# Patient Record
Sex: Female | Born: 1965 | Race: White | Hispanic: No | Marital: Single | Smoking: Never smoker
Health system: Southern US, Community
[De-identification: ages and names within clinical notes are randomized; demographics above are authoritative.]

## PROBLEM LIST (undated history)

## (undated) DIAGNOSIS — Q431 Hirschsprung's disease: Secondary | ICD-10-CM

## (undated) DIAGNOSIS — K219 Gastro-esophageal reflux disease without esophagitis: Secondary | ICD-10-CM

## (undated) DIAGNOSIS — Q909 Down syndrome, unspecified: Secondary | ICD-10-CM

## (undated) DIAGNOSIS — E079 Disorder of thyroid, unspecified: Secondary | ICD-10-CM

## (undated) DIAGNOSIS — F0390 Unspecified dementia without behavioral disturbance: Secondary | ICD-10-CM

## (undated) DIAGNOSIS — K439 Ventral hernia without obstruction or gangrene: Secondary | ICD-10-CM

## (undated) HISTORY — PX: COLOSTOMY: SHX63

## (undated) HISTORY — DX: Ventral hernia without obstruction or gangrene: K43.9

---

## 2004-08-04 ENCOUNTER — Ambulatory Visit: Payer: Self-pay | Admitting: General Surgery

## 2004-08-07 ENCOUNTER — Emergency Department: Payer: Self-pay | Admitting: Unknown Physician Specialty

## 2005-02-11 ENCOUNTER — Ambulatory Visit: Payer: Self-pay | Admitting: Podiatry

## 2006-02-05 ENCOUNTER — Inpatient Hospital Stay: Payer: Self-pay | Admitting: Internal Medicine

## 2006-02-22 ENCOUNTER — Ambulatory Visit: Payer: Self-pay | Admitting: Surgery

## 2006-02-28 ENCOUNTER — Other Ambulatory Visit: Payer: Self-pay

## 2006-03-07 ENCOUNTER — Inpatient Hospital Stay: Payer: Self-pay | Admitting: Surgery

## 2007-03-29 ENCOUNTER — Ambulatory Visit: Payer: Self-pay | Admitting: Internal Medicine

## 2007-04-12 ENCOUNTER — Ambulatory Visit: Payer: Self-pay | Admitting: Internal Medicine

## 2008-08-21 ENCOUNTER — Ambulatory Visit: Payer: Self-pay | Admitting: Family Medicine

## 2008-09-18 ENCOUNTER — Ambulatory Visit: Payer: Self-pay | Admitting: Family Medicine

## 2008-10-18 ENCOUNTER — Ambulatory Visit: Payer: Self-pay | Admitting: Family Medicine

## 2009-01-14 ENCOUNTER — Ambulatory Visit: Payer: Self-pay

## 2010-10-12 ENCOUNTER — Ambulatory Visit: Payer: Self-pay | Admitting: Family Medicine

## 2011-12-20 ENCOUNTER — Ambulatory Visit: Payer: Self-pay | Admitting: Family Medicine

## 2012-01-24 ENCOUNTER — Ambulatory Visit: Payer: Self-pay | Admitting: Otolaryngology

## 2013-04-08 ENCOUNTER — Ambulatory Visit: Payer: Self-pay | Admitting: Family Medicine

## 2014-05-01 ENCOUNTER — Ambulatory Visit: Payer: Self-pay | Admitting: Family Medicine

## 2014-12-23 DIAGNOSIS — J302 Other seasonal allergic rhinitis: Secondary | ICD-10-CM | POA: Insufficient documentation

## 2014-12-23 DIAGNOSIS — K219 Gastro-esophageal reflux disease without esophagitis: Secondary | ICD-10-CM | POA: Insufficient documentation

## 2014-12-23 DIAGNOSIS — Q431 Hirschsprung's disease: Secondary | ICD-10-CM | POA: Insufficient documentation

## 2015-03-14 ENCOUNTER — Emergency Department: Payer: Medicare HMO

## 2015-03-14 ENCOUNTER — Inpatient Hospital Stay
Admission: EM | Admit: 2015-03-14 | Discharge: 2015-03-16 | DRG: 354 | Disposition: A | Discharge status: Home / Self Care | Payer: Medicare HMO | Attending: Surgery | Admitting: Surgery

## 2015-03-14 ENCOUNTER — Encounter: Payer: Self-pay | Admitting: *Deleted

## 2015-03-14 DIAGNOSIS — Z880 Allergy status to penicillin: Secondary | ICD-10-CM

## 2015-03-14 DIAGNOSIS — K219 Gastro-esophageal reflux disease without esophagitis: Secondary | ICD-10-CM | POA: Diagnosis present

## 2015-03-14 DIAGNOSIS — Q909 Down syndrome, unspecified: Secondary | ICD-10-CM

## 2015-03-14 DIAGNOSIS — R112 Nausea with vomiting, unspecified: Secondary | ICD-10-CM

## 2015-03-14 DIAGNOSIS — F70 Mild intellectual disabilities: Secondary | ICD-10-CM | POA: Diagnosis present

## 2015-03-14 DIAGNOSIS — K56600 Partial intestinal obstruction, unspecified as to cause: Secondary | ICD-10-CM

## 2015-03-14 DIAGNOSIS — Z933 Colostomy status: Secondary | ICD-10-CM

## 2015-03-14 DIAGNOSIS — E079 Disorder of thyroid, unspecified: Secondary | ICD-10-CM | POA: Diagnosis present

## 2015-03-14 DIAGNOSIS — Q431 Hirschsprung's disease: Secondary | ICD-10-CM

## 2015-03-14 DIAGNOSIS — K436 Other and unspecified ventral hernia with obstruction, without gangrene: Secondary | ICD-10-CM | POA: Diagnosis not present

## 2015-03-14 DIAGNOSIS — I959 Hypotension, unspecified: Secondary | ICD-10-CM

## 2015-03-14 DIAGNOSIS — R1084 Generalized abdominal pain: Secondary | ICD-10-CM

## 2015-03-14 DIAGNOSIS — E860 Dehydration: Secondary | ICD-10-CM

## 2015-03-14 HISTORY — DX: Gastro-esophageal reflux disease without esophagitis: K21.9

## 2015-03-14 HISTORY — DX: Disorder of thyroid, unspecified: E07.9

## 2015-03-14 HISTORY — DX: Hirschsprung's disease: Q43.1

## 2015-03-14 LAB — LIPASE, BLOOD: LIPASE: 26 U/L (ref 22–51)

## 2015-03-14 LAB — CBC WITH DIFFERENTIAL/PLATELET
BASOS ABS: 0.1 10*3/uL (ref 0–0.1)
BASOS PCT: 1 %
EOS PCT: 0 %
Eosinophils Absolute: 0 10*3/uL (ref 0–0.7)
HCT: 42.2 % (ref 35.0–47.0)
Hemoglobin: 14.1 g/dL (ref 12.0–16.0)
Lymphocytes Relative: 6 %
Lymphs Abs: 1 10*3/uL (ref 1.0–3.6)
MCH: 31 pg (ref 26.0–34.0)
MCHC: 33.4 g/dL (ref 32.0–36.0)
MCV: 92.9 fL (ref 80.0–100.0)
MONO ABS: 0.7 10*3/uL (ref 0.2–0.9)
Monocytes Relative: 4 %
Neutro Abs: 16.2 10*3/uL — ABNORMAL HIGH (ref 1.4–6.5)
Neutrophils Relative %: 89 %
PLATELETS: 376 10*3/uL (ref 150–440)
RBC: 4.55 MIL/uL (ref 3.80–5.20)
RDW: 13.8 % (ref 11.5–14.5)
WBC: 18.1 10*3/uL — ABNORMAL HIGH (ref 3.6–11.0)

## 2015-03-14 LAB — COMPREHENSIVE METABOLIC PANEL
ALBUMIN: 3.8 g/dL (ref 3.5–5.0)
ALT: 14 U/L (ref 14–54)
ANION GAP: 10 (ref 5–15)
AST: 34 U/L (ref 15–41)
Alkaline Phosphatase: 60 U/L (ref 38–126)
BUN: 11 mg/dL (ref 6–20)
CHLORIDE: 105 mmol/L (ref 101–111)
CO2: 22 mmol/L (ref 22–32)
Calcium: 9.1 mg/dL (ref 8.9–10.3)
Creatinine, Ser: 0.83 mg/dL (ref 0.44–1.00)
GFR calc Af Amer: >60 mL/min (ref >60)
Glucose, Bld: 171 mg/dL — ABNORMAL HIGH (ref 65–99)
POTASSIUM: 3.6 mmol/L (ref 3.5–5.1)
Sodium: 137 mmol/L (ref 135–145)
Total Bilirubin: 0.3 mg/dL (ref 0.3–1.2)
Total Protein: 7.8 g/dL (ref 6.5–8.1)

## 2015-03-14 LAB — TROPONIN I: Troponin I: <0.03 ng/mL (ref <0.031)

## 2015-03-14 MED ORDER — ONDANSETRON HCL 4 MG/2ML IJ SOLN
4.0000 mg | Freq: Once | INTRAMUSCULAR | Status: AC
  Administered 2015-03-14: 4 mg via INTRAVENOUS
  Filled 2015-03-14: qty 2

## 2015-03-14 MED ORDER — SODIUM CHLORIDE 0.9 % IV BOLUS (SEPSIS)
1000.0000 mL | Freq: Once | INTRAVENOUS | Status: AC
  Administered 2015-03-15: 1000 mL via INTRAVENOUS

## 2015-03-14 MED ORDER — ONDANSETRON HCL 4 MG/2ML IJ SOLN
4.0000 mg | Freq: Once | INTRAMUSCULAR | Status: AC
  Administered 2015-03-15: 4 mg via INTRAVENOUS
  Filled 2015-03-14: qty 2

## 2015-03-14 MED ORDER — MORPHINE SULFATE (PF) 2 MG/ML IV SOLN
2.0000 mg | Freq: Once | INTRAVENOUS | Status: AC
  Administered 2015-03-14: 2 mg via INTRAVENOUS
  Filled 2015-03-14: qty 1

## 2015-03-14 MED ORDER — SODIUM CHLORIDE 0.9 % IV BOLUS (SEPSIS)
1000.0000 mL | Freq: Once | INTRAVENOUS | Status: AC
  Administered 2015-03-14: 1000 mL via INTRAVENOUS

## 2015-03-14 MED ORDER — IOHEXOL 240 MG/ML SOLN
25.0000 mL | Freq: Once | INTRAMUSCULAR | Status: AC | PRN
Start: 2015-03-14 — End: 2015-03-14
  Administered 2015-03-14: 25 mL via ORAL

## 2015-03-14 NOTE — ED Provider Notes (Signed)
[facility] Emergency Department Provider Note  ____________________________________________  Time seen: Approximately 11:04 PM  I have reviewed the triage vital signs and the nursing notes.   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 States patient was in her usual state of good health until she began to vomit multiple times since 7 PM. Denies recent fever, chills, chest pain, shortness of breath, dysuria, diarrhea. Denies recent travel or injury. Patient states she is producing gas through her ostomy bag as well as formed stool. Denies history of small bowel obstruction. Arrives to the ED hypotensive and actively vomiting.   Past Medical History  Diagnosis Date  . Thyroid disease   . GERD (gastroesophageal reflux disease)   . Hirschsprung disease     There are no active problems to display for this patient.   Past Surgical History  Procedure Laterality Date  . Colostomy      No current outpatient prescriptions on file.  Allergies Penicillins  No family history on file.  Social History Social History  Substance Use Topics  . Smoking status: Never Smoker   . Smokeless tobacco: None  . Alcohol Use: No    Review of Systems Constitutional: No fever/chills Eyes: No visual changes. ENT: No sore throat. Cardiovascular: Denies chest pain. Respiratory: Denies shortness of breath. Gastrointestinal: Positive for abdominal pain.  Positive for nausea and vomiting.  No diarrhea.  No constipation. Genitourinary: Negative for dysuria. Musculoskeletal: Negative for back pain. Skin: Negative for  rash. Neurological: Negative for headaches, focal weakness or numbness.  10-point ROS otherwise negative.  ____________________________________________   PHYSICAL EXAM:  VITAL SIGNS: ED Triage Vitals  Enc Vitals Group     BP 03/14/15 2255 85/50 mmHg     Pulse Rate 03/14/15 2255 72     Resp 03/14/15 2255 20     Temp 03/14/15 2255 97.5 F (36.4 C)     Temp src --      SpO2 03/14/15 2255 100 %     Weight --      Height --      Head Cir --      Peak Flow --      Pain Score 03/14/15 2250 10     Pain Loc --      Pain Edu? --      Excl. in GC? --     Constitutional: Alert and oriented. Uncomfortable-appearing and in moderate acute distress. Eyes: Conjunctivae are normal. PERRL. EOMI. Head: Atraumatic. Nose: No congestion/rhinnorhea. Mouth/Throat: Mucous membranes are dry.  Oropharynx non-erythematous. Neck: No stridor.   Cardiovascular: Normal rate, regular rhythm. Grossly normal heart sounds.  Good peripheral circulation. Respiratory: Normal respiratory effort.  No retractions. Lungs CTAB. Gastrointestinal: Soft, moderately tender to palpation diffusely without rebound or guarding. Mild distention. Positive bowel sounds. Ostomy the bag containing formed, brown stool and gas. Large right lower ventral hernia noted. No abdominal bruits. No CVA tenderness. Musculoskeletal: No lower extremity tenderness nor edema.  No joint effusions. Neurologic:  Flat facies. Normal speech and language. No gross focal neurologic deficits are appreciated.  Skin:  Skin is pale, warm, dry and intact. No rash noted. Psychiatric: Mood  and affect are normal. Speech and behavior are normal.  ____________________________________________   LABS (all labs ordered are listed, but only abnormal results are displayed)  Labs Reviewed  CBC WITH DIFFERENTIAL/PLATELET - Abnormal; Notable for the following:    WBC 18.1 (*)    Neutro Abs 16.2 (*)    All other components within normal limits  COMPREHENSIVE  METABOLIC PANEL - Abnormal; Notable for the following:    Glucose, Bld 171 (*)    All other components within normal limits  LIPASE, BLOOD  TROPONIN I  LACTIC ACID, PLASMA   ____________________________________________  EKG  ED ECG REPORT I, SUNG,JADE J, the attending physician, personally viewed and interpreted this ECG.   Date: 03/15/2015  EKG Time: 0108  Rate: 76  Rhythm: normal EKG, normal sinus rhythm  Axis: Normal  Intervals:none  ST&T Change: Nonspecific  ____________________________________________  RADIOLOGY  CT abdomen and pelvis with IV contrast interpreted per Dr. Andria Meuse: Left lower quadrant descending colostomy with peristomal hernia containing fat. Right anterior mid abdominal wall hernia containing bowel, probably transverse colon. Suggestion some degree of obstruction and possible fat necrosis associated with the hernia. No findings suggesting bowel necrosis.  ____________________________________________   PROCEDURES  Procedure(s) performed: None  Critical Care performed:   CRITICAL CARE Performed by: Irean Hong   Total critical care time: 30 minutes  Critical care time was exclusive of separately billable procedures and treating other patients.  Critical care was necessary to treat or prevent imminent or life-threatening deterioration.  Critical care was time spent personally by me on the following activities: development of treatment plan with patient and/or surrogate as well as nursing, discussions with consultants, evaluation of patient's response to treatment, examination of patient, obtaining history from patient or surrogate, ordering and performing treatments and interventions, ordering and review of laboratory studies, ordering and review of radiographic studies, pulse oximetry and re-evaluation of patient's condition.  ____________________________________________   INITIAL IMPRESSION / ASSESSMENT AND PLAN / ED COURSE  Pertinent  labs & imaging results that were available during my care of the patient were reviewed by me and considered in my medical decision making (see chart for details).  49 year old female with a history of Down syndrome, permanent colostomy secondary to Hirschsprung disease who presents with diffuse abdominal pain, nausea and vomiting. IV fluid resuscitation started. Will administer IV analgesia, analgesic, obtain lab work including lactate as well as CT abdomen/pelvis to evaluate etiology of patient's pain and vomiting.  ----------------------------------------- 11:56 PM on 03/14/2015 -----------------------------------------  Patient continues to dry heave; no actual emesis. Second dose IV Zofran to be administered. Sister requests patient skip oral contrast and be sent for CT scan.  ----------------------------------------- 1:30 AM on 03/15/2015 -----------------------------------------  Updated patient and sister of CT results. Discussed with Dr. Sharmon Revere to evaluate patient in the emergency department. Additional round of analgesia and antiemetic ordered for persistent pain and nausea. ____________________________________________   FINAL CLINICAL IMPRESSION(S) / ED DIAGNOSES  Final diagnoses:  Generalized abdominal pain  Non-intractable vomiting with nausea, vomiting of unspecified type  Dehydration  Partial bowel obstruction  Hypotension, unspecified hypotension type      Irean Hong, MD 03/15/15 [phone]

## 2015-03-14 NOTE — ED Notes (Signed)
Pt brought in by sister who report abdominal pain and emesis. Severe pain for a few hours. Sister reports pt never complains of pain. Hx of colostomy since birth. Has been vomiting 8-9 times.

## 2015-03-15 ENCOUNTER — Emergency Department: Payer: Medicare HMO | Admitting: Certified Registered Nurse Anesthetist

## 2015-03-15 ENCOUNTER — Encounter
Admission: EM | Disposition: A | Discharge status: Home / Self Care | Payer: Self-pay | Source: Home / Self Care | Attending: Surgery

## 2015-03-15 ENCOUNTER — Other Ambulatory Visit: Payer: Self-pay

## 2015-03-15 ENCOUNTER — Emergency Department: Payer: Medicare HMO

## 2015-03-15 DIAGNOSIS — K436 Other and unspecified ventral hernia with obstruction, without gangrene: Secondary | ICD-10-CM | POA: Insufficient documentation

## 2015-03-15 DIAGNOSIS — I959 Hypotension, unspecified: Secondary | ICD-10-CM | POA: Diagnosis present

## 2015-03-15 DIAGNOSIS — E079 Disorder of thyroid, unspecified: Secondary | ICD-10-CM | POA: Diagnosis present

## 2015-03-15 DIAGNOSIS — K5669 Other intestinal obstruction: Secondary | ICD-10-CM | POA: Diagnosis not present

## 2015-03-15 DIAGNOSIS — K56600 Partial intestinal obstruction, unspecified as to cause: Secondary | ICD-10-CM | POA: Insufficient documentation

## 2015-03-15 DIAGNOSIS — Q909 Down syndrome, unspecified: Secondary | ICD-10-CM | POA: Diagnosis not present

## 2015-03-15 DIAGNOSIS — F70 Mild intellectual disabilities: Secondary | ICD-10-CM | POA: Diagnosis present

## 2015-03-15 DIAGNOSIS — K219 Gastro-esophageal reflux disease without esophagitis: Secondary | ICD-10-CM | POA: Diagnosis present

## 2015-03-15 DIAGNOSIS — Z933 Colostomy status: Secondary | ICD-10-CM | POA: Diagnosis not present

## 2015-03-15 DIAGNOSIS — Z880 Allergy status to penicillin: Secondary | ICD-10-CM | POA: Diagnosis not present

## 2015-03-15 DIAGNOSIS — Q431 Hirschsprung's disease: Secondary | ICD-10-CM | POA: Diagnosis not present

## 2015-03-15 DIAGNOSIS — E860 Dehydration: Secondary | ICD-10-CM | POA: Diagnosis present

## 2015-03-15 HISTORY — PX: VENTRAL HERNIA REPAIR: SHX424

## 2015-03-15 LAB — LACTIC ACID, PLASMA
Lactic Acid, Venous: 4 mmol/L (ref 0.5–2.0)
Lactic Acid, Venous: 3.4 mmol/L (ref 0.5–2.0)

## 2015-03-15 SURGERY — REPAIR, HERNIA, VENTRAL
Anesthesia: General | Wound class: Clean

## 2015-03-15 MED ORDER — FENTANYL CITRATE (PF) 100 MCG/2ML IJ SOLN
25.0000 ug | INTRAMUSCULAR | Status: DC | PRN
  Administered 2015-03-15: 25 ug via INTRAVENOUS

## 2015-03-15 MED ORDER — FENTANYL CITRATE (PF) 100 MCG/2ML IJ SOLN
INTRAMUSCULAR | Status: DC | PRN
  Administered 2015-03-15 (×2): 50 ug via INTRAVENOUS

## 2015-03-15 MED ORDER — MORPHINE SULFATE (PF) 2 MG/ML IV SOLN
2.0000 mg | Freq: Once | INTRAVENOUS | Status: AC
  Administered 2015-03-15: 2 mg via INTRAVENOUS
  Filled 2015-03-15: qty 1

## 2015-03-15 MED ORDER — OXYCODONE HCL 5 MG/5ML PO SOLN: 5.0000 mg | ORAL | Status: DC | PRN

## 2015-03-15 MED ORDER — SUGAMMADEX SODIUM 200 MG/2ML IV SOLN
INTRAVENOUS | Status: DC | PRN
  Administered 2015-03-15: 100 mg via INTRAVENOUS

## 2015-03-15 MED ORDER — ACETAMINOPHEN 325 MG PO TABS: 650.0000 mg | ORAL_TABLET | Freq: Four times a day (QID) | ORAL | Status: DC | PRN

## 2015-03-15 MED ORDER — PHENYLEPHRINE HCL 10 MG/ML IJ SOLN
INTRAMUSCULAR | Status: DC | PRN
  Administered 2015-03-15 (×8): 100 ug via INTRAVENOUS

## 2015-03-15 MED ORDER — IOHEXOL 300 MG/ML  SOLN
75.0000 mL | Freq: Once | INTRAMUSCULAR | Status: AC | PRN
  Administered 2015-03-15: 75 mL via INTRAVENOUS

## 2015-03-15 MED ORDER — FENTANYL CITRATE (PF) 100 MCG/2ML IJ SOLN
INTRAMUSCULAR | Status: AC
  Filled 2015-03-15: qty 2

## 2015-03-15 MED ORDER — FLUTICASONE PROPIONATE 50 MCG/ACT NA SUSP
2.0000 | Freq: Every day | NASAL | Status: DC
  Filled 2015-03-15: qty 16

## 2015-03-15 MED ORDER — PROPOFOL 10 MG/ML IV BOLUS
INTRAVENOUS | Status: DC | PRN
  Administered 2015-03-15: 120 mg via INTRAVENOUS

## 2015-03-15 MED ORDER — LEVOTHYROXINE SODIUM 75 MCG PO TABS
37.5000 ug | ORAL_TABLET | Freq: Every day | ORAL | Status: DC
  Administered 2015-03-16: 37.5 ug via ORAL
  Filled 2015-03-15: qty 1

## 2015-03-15 MED ORDER — ACETAMINOPHEN 160 MG/5ML PO SOLN: 325.0000 mg | ORAL | Status: DC | PRN

## 2015-03-15 MED ORDER — ONDANSETRON HCL 4 MG/2ML IJ SOLN: 4.0000 mg | Freq: Once | INTRAMUSCULAR | Status: DC | PRN

## 2015-03-15 MED ORDER — ONDANSETRON 4 MG PO TBDP: 4.0000 mg | ORAL_TABLET | Freq: Four times a day (QID) | ORAL | Status: DC | PRN

## 2015-03-15 MED ORDER — LIDOCAINE HCL (CARDIAC) 20 MG/ML IV SOLN
INTRAVENOUS | Status: DC | PRN
Start: 2015-03-15 — End: 2015-03-15
  Administered 2015-03-15: 50 mg via INTRAVENOUS

## 2015-03-15 MED ORDER — FLUTICASONE PROPIONATE 50 MCG/ACT NA SUSP
1.0000 | Freq: Every day | NASAL | Status: DC
  Administered 2015-03-15: 1 via NASAL
  Filled 2015-03-15: qty 16

## 2015-03-15 MED ORDER — LORATADINE 10 MG PO TABS
10.0000 mg | ORAL_TABLET | Freq: Every day | ORAL | Status: DC
  Administered 2015-03-15 – 2015-03-16 (×2): 10 mg via ORAL
  Filled 2015-03-15 (×3): qty 1

## 2015-03-15 MED ORDER — HEPARIN SODIUM (PORCINE) 5000 UNIT/ML IJ SOLN
5000.0000 [IU] | Freq: Three times a day (TID) | INTRAMUSCULAR | Status: DC
  Administered 2015-03-15 – 2015-03-16 (×4): 5000 [IU] via SUBCUTANEOUS
  Filled 2015-03-15 (×4): qty 1

## 2015-03-15 MED ORDER — ONDANSETRON HCL 4 MG/2ML IJ SOLN
INTRAMUSCULAR | Status: DC | PRN
  Administered 2015-03-15: 4 mg via INTRAVENOUS
  Administered 2015-03-15: 5 mg via INTRAVENOUS

## 2015-03-15 MED ORDER — SUCCINYLCHOLINE CHLORIDE 20 MG/ML IJ SOLN
INTRAMUSCULAR | Status: DC | PRN
  Administered 2015-03-15: 100 mg via INTRAVENOUS

## 2015-03-15 MED ORDER — ACETAMINOPHEN 650 MG RE SUPP
650.0000 mg | Freq: Four times a day (QID) | RECTAL | Status: DC | PRN
  Filled 2015-03-15: qty 1

## 2015-03-15 MED ORDER — LACTATED RINGERS IV SOLN
INTRAVENOUS | Status: DC | PRN
  Administered 2015-03-15 (×2): via INTRAVENOUS

## 2015-03-15 MED ORDER — HYDROMORPHONE HCL 1 MG/ML IJ SOLN: 0.5000 mg | INTRAMUSCULAR | Status: DC | PRN

## 2015-03-15 MED ORDER — METOCLOPRAMIDE HCL 5 MG/ML IJ SOLN
5.0000 mg | Freq: Once | INTRAMUSCULAR | Status: AC
  Administered 2015-03-15: 5 mg via INTRAVENOUS
  Filled 2015-03-15: qty 2

## 2015-03-15 MED ORDER — PANTOPRAZOLE SODIUM 40 MG IV SOLR
40.0000 mg | Freq: Every day | INTRAVENOUS | Status: DC
  Administered 2015-03-15: 40 mg via INTRAVENOUS
  Filled 2015-03-15: qty 40

## 2015-03-15 MED ORDER — SODIUM CHLORIDE 0.9 % IV BOLUS (SEPSIS): 1000.0000 mL | Freq: Once | INTRAVENOUS | Status: DC

## 2015-03-15 MED ORDER — ROCURONIUM BROMIDE 100 MG/10ML IV SOLN
INTRAVENOUS | Status: DC | PRN
  Administered 2015-03-15: 20 mg via INTRAVENOUS
  Administered 2015-03-15: 1 mg via INTRAVENOUS

## 2015-03-15 MED ORDER — ONDANSETRON HCL 4 MG/2ML IJ SOLN: 4.0000 mg | Freq: Four times a day (QID) | INTRAMUSCULAR | Status: DC | PRN

## 2015-03-15 MED ORDER — MIDAZOLAM HCL 2 MG/2ML IJ SOLN
INTRAMUSCULAR | Status: DC | PRN
  Administered 2015-03-15: 2 mg via INTRAVENOUS

## 2015-03-15 MED ORDER — SODIUM CHLORIDE 0.9 % IV SOLN
INTRAVENOUS | Status: DC
  Administered 2015-03-15: 1 mL via INTRAVENOUS
  Administered 2015-03-15 – 2015-03-16 (×3): via INTRAVENOUS

## 2015-03-15 MED ORDER — CLINDAMYCIN PHOSPHATE 600 MG/50ML IV SOLN
600.0000 mg | Freq: Once | INTRAVENOUS | Status: AC
  Administered 2015-03-15: 600 mg via INTRAVENOUS
  Filled 2015-03-15: qty 50

## 2015-03-15 MED ORDER — MORPHINE SULFATE (PF) 2 MG/ML IV SOLN
2.0000 mg | INTRAVENOUS | Status: DC | PRN
  Administered 2015-03-15 – 2015-03-16 (×4): 2 mg via INTRAVENOUS
  Filled 2015-03-15 (×4): qty 1

## 2015-03-15 SURGICAL SUPPLY — 25 items
CANISTER SUCT 1200ML W/VALVE (MISCELLANEOUS) ×3 IMPLANT
CHLORAPREP W/TINT 26ML (MISCELLANEOUS) ×3 IMPLANT
DRAPE LAPAROTOMY 100X77 ABD (DRAPES) ×3 IMPLANT
DRSG OPSITE POSTOP 4X8 (GAUZE/BANDAGES/DRESSINGS) ×3 IMPLANT
GAUZE SPONGE 4X4 12PLY STRL (GAUZE/BANDAGES/DRESSINGS) ×3 IMPLANT
GLOVE BIO SURGEON STRL SZ7.5 (GLOVE) ×9 IMPLANT
GOWN STRL REUS W/ TWL LRG LVL3 (GOWN DISPOSABLE) ×2 IMPLANT
GOWN STRL REUS W/TWL LRG LVL3 (GOWN DISPOSABLE) ×4
JACKSON PRATT 10 (INSTRUMENTS) ×3 IMPLANT
LABEL OR SOLS (LABEL) ×3 IMPLANT
NS IRRIG 500ML POUR BTL (IV SOLUTION) ×3 IMPLANT
PACK BASIN MAJOR ARMC (MISCELLANEOUS) ×3 IMPLANT
PAD GROUND ADULT SPLIT (MISCELLANEOUS) ×3 IMPLANT
POUCH DRAIN 1 3/4 SMALL GREEN (OSTOMY) ×3 IMPLANT
RETAINER VISCERA MED (MISCELLANEOUS) IMPLANT
STAPLER SKIN PROX 35W (STAPLE) ×3 IMPLANT
SUT ETHIBOND 0 MO6 C/R (SUTURE) ×3 IMPLANT
SUT ETHILON 3-0 FS-10 30 BLK (SUTURE) ×3
SUT PROLENE 0 CT 1 30 (SUTURE) ×3 IMPLANT
SUT SILK 3-0 (SUTURE) ×3 IMPLANT
SUT VIC AB 3-0 SH 27 (SUTURE) ×2
SUT VIC AB 3-0 SH 27X BRD (SUTURE) ×1 IMPLANT
SUT VICRYL+ 3-0 144IN (SUTURE) IMPLANT
SUTURE EHLN 3-0 FS-10 30 BLK (SUTURE) ×1 IMPLANT
WAFER FLANGE 1 3/4 SMALL GREEN (OSTOMY) ×3 IMPLANT

## 2015-03-15 NOTE — Transfer of Care (Signed)
Immediate Anesthesia Transfer of Care Note  Patient: Lauren Nixon  Procedure(s) Performed: Procedure(s): HERNIA REPAIR VENTRAL ADULT (N/A)  Patient Location: PACU  Anesthesia Type:General  Level of Consciousness: Alert, Awake, Oriented  Airway & Oxygen Therapy: Patient Spontanous Breathing  Post-op Assessment: Report given to RN  Post vital signs: Reviewed and stable  Last Vitals:  Filed Vitals:   03/15/15 0546  BP: 97/70  Pulse:   Temp: 36.4 C  Resp: 21    Complications: No apparent anesthesia complications

## 2015-03-15 NOTE — H&P (Signed)
Surgical H and P  CC: Abdominal pain, nausea/vomiting  HPI: [name] is a pleasant 49 yo F with Downs syndrome with a history of multiple colostomies for Hirshprungs disease and VHR with mesh who presents with 1 day of acute onset nausea/vomiting and pain.  Still with nausea/vomiting.  Pain initially epigastric and in waves, now in RLQ.  + subjective chills.  Still with good ostomy function per family. Hypotensive at admission, WBC 18.  No chest pain, shortness of breath, cough, fevers, diarrhea/constipation, dysuria/hematuria.  Sister, who is POA, is main source of history.  Active Ambulatory Problems    Diagnosis Date Noted  . No Active Ambulatory Problems   Resolved Ambulatory Problems    Diagnosis Date Noted  . No Resolved Ambulatory Problems   Past Medical History  Diagnosis Date  . Thyroid disease   . GERD (gastroesophageal reflux disease)   . Hirschsprung disease    Past Surgical History  Procedure Laterality Date  . Colostomy       Medication List    Notice    You have not been prescribed any medications.     Allergies  Allergen Reactions  . Penicillins    No family history on file.   Social History   Social History  . Marital Status: Single    Spouse Name: N/A  . Number of Children: N/A  . Years of Education: N/A   Occupational History  . Not on file.   Social History Main Topics  . Smoking status: Never Smoker   . Smokeless tobacco: Not on file  . Alcohol Use: No  . Drug Use: No  . Sexual Activity: Not on file   Other Topics Concern  . Not on file   Social History Narrative   ROS: Full ROS obtained, pertinent positives and negatives as above.  Blood pressure 96/68, pulse 81, temperature 97.5 F (36.4 C), resp. rate 18, SpO2 99 %. GEN: NAD/A&Ox3 FACE: no obvious facial trauma, normal external nose, normal external ears EYES: no scleral icterus, no conjunctivitis HEAD: normocephalic atraumatic CV: RRR, no MRG RESP: moving air well, lungs  clear ABD: soft, tender to palpation RLQ with large, hard, unreducible bulge, nondistended EXT: moving all ext well, strength 5/5 NEURO: cnII-XII grossly intact, sensation intact all 4 ext  Labs: Personally reviewed, significant for WBC 18.1 with 89% neutrophils  CT: Personally reviewed, moderate size Right periumbilical hernia with some bowel, ? Transition point  A/P 49 yo F who presents with tender, nonreducible right sided abdominal hernia with incarcerated bowel ? SBO.  Tender.  Concerned with leukocytosis and emesis which began prior to pain that this bowel may be compromised and even if not would not likely reduce.  Have discussed and recommended possible surgical intervention with sister who is POA.  She is discussing this with family.  I have discussed this procedure and its risks and benefits.

## 2015-03-15 NOTE — Progress Notes (Signed)
Called Cr. Woodham and spoke to his nurse to report that the pt's Lactic acid was 4.0. No orders given.

## 2015-03-15 NOTE — Progress Notes (Signed)
Surgery progress note  S:  Good spirits. Min pain.  Tolerating liquids.  Has been ambulating. O:Blood pressure 97/57, pulse 86, temperature 98 F (36.7 C), temperature source Oral, resp. rate 18, height 4' (1.219 m), weight 128 lb 9.6 oz (58.333 kg), SpO2 97 %. GEN: NAD/A&Ox3 ABD: soft, min tender, nondistended, dressing c/d/i, no obvious recurrent hernia  Labs: Lactate 3.4  A/P 49 yo s/p repair of incarcerated VH.  Doing well.   - liquids for now - am labs - continue ambulation

## 2015-03-15 NOTE — Anesthesia Postprocedure Evaluation (Signed)
  Anesthesia Post-op Note  Patient: Lauren Nixon  Procedure(s) Performed: Procedure(s): VENTRAL HERNIA REPAIR FOR INCARCERATED VENTRAL HERNIA  (N/A)  Anesthesia type:General  Patient location: PACU  Post pain: Pain level controlled  Post assessment: Post-op Vital signs reviewed, Patient's Cardiovascular Status Stable, Respiratory Function Stable, Patent Airway and No signs of Nausea or vomiting  Post vital signs: Reviewed and stable  Last Vitals:  Filed Vitals:   03/15/15 0546  BP: 97/70  Pulse:   Temp: 36.4 C  Resp: 21    Level of consciousness: awake, alert  and patient cooperative  Complications: No apparent anesthesia complications

## 2015-03-15 NOTE — Op Note (Signed)
Preop diagnosis: incarcerated ventral hernia Postop diagnosis: Same Procedure performed: Repair of incarcerated ventral hernia Anesthesia: GETA EBL: 25 ml Specimen: None Drains: JP in subcutaneous tissue  Indication for Surgery: Lauren Nixon is a pleasant 49 yo F who presents with acute onset nausea/vomiting and CT and physical exam consistent with incarcerated ventral hernia.  She was brought to the OR for surgical repair of incarcerated ventral hernia.  Details of Procedure.  Informed consent was obtained from Lauren Nixon's POA her sister.  She was brought to the OR suite and laid supine on the OR table.  She was induced, ETT was placed and general anesthesia was administered.  Her abdomen was prepped and draped.  A midline incision was made to the left of the unreducible mass.  It was deepened down to the fascia.  The fascia was then dissected to the right until the hernia sac was encountered.  It was hard and had a bluish tint.  It was encircled and the sac was entered.  There were numerous loops of congested bowel that were unable to be reduced.  The fascial defect was then enlarged to be able to reduce the incarcerated bowel and omentum.  Once reduced, I noted that there were 2 more hernias lateral to the defect.  The herniated contents were reduced and the fascial defects were connected.  The total hernia defect was approx 4 cm in length.  The fascia was then cleared off and then closed with a running 0-prolene suture.  Due to the amount of deadspace from the sac, a 10 JP drain was placed into this cavity and sutured to the skin with a 3-0 nylon suture.  The umbilicus was then reattached to the fascial repair using an interrupted 3-0 vicryl.  The skin was then closed with staples and a sterile dressing was placed over the wound.  The patient was then awoken, extubated and transferred to the PACU.  There were no immediate complications.  Needle, sponge and instrument count was correct at the end of the  procedure.

## 2015-03-15 NOTE — Progress Notes (Signed)
Day of Surgery   Subjective: 49 year old female status post repair of incarcerated hernia. Patient states she's doing well and is tolerating her clear liquid diet. She is very happy with her surgical care at this point.  Vital signs in last 24 hours: Temp:  [97 F (36.1 C)-98.3 F (36.8 C)] 98.3 F (36.8 C) (09/25 1309) Pulse Rate:  [54-123] 102 (09/25 1205) Resp:  [11-21] 18 (09/25 1309) BP: (85-124)/(50-88) 101/58 mmHg (09/25 1309) SpO2:  [93 %-100 %] 98 % (09/25 1205) Weight:  [58.333 kg (128 lb 9.6 oz)] 58.333 kg (128 lb 9.6 oz) (09/25 0650)    Intake/Output from previous day: 09/24 0701 - 09/25 0700 In: 2000 [I.V.:2000] Out: 605 [Urine:600; Drains:5]  GI: Abdomen is soft, probably tender to palpation around the incision site. Nondistended with active bowel sounds. No evidence of peritoneal signs  Lab Results:  CBC  Recent Labs  03/14/15 2309  WBC 18.1*  HGB 14.1  HCT 42.2  PLT 376   CMP     Component Value Date/Time   NA 137 03/14/2015 2309   K 3.6 03/14/2015 2309   CL 105 03/14/2015 2309   CO2 22 03/14/2015 2309   GLUCOSE 171* 03/14/2015 2309   BUN 11 03/14/2015 2309   CREATININE 0.83 03/14/2015 2309   CALCIUM 9.1 03/14/2015 2309   PROT 7.8 03/14/2015 2309   ALBUMIN 3.8 03/14/2015 2309   AST 34 03/14/2015 2309   ALT 14 03/14/2015 2309   ALKPHOS 60 03/14/2015 2309   BILITOT 0.3 03/14/2015 2309   GFRNONAA >60 03/14/2015 2309   GFRAA >60 03/14/2015 2309   PT/INR No results for input(s): LABPROT, INR in the last 72 hours.  Studies/Results: Ct Abdomen Pelvis W Contrast  03/15/2015   CLINICAL DATA:  Abdominal pain and cramping since 7 p.m. tonight. Nausea and vomiting. Leukocytosis. History of colostomy from birth and hernia repair.  EXAM: CT ABDOMEN AND PELVIS WITH CONTRAST  TECHNIQUE: Multidetector CT imaging of the abdomen and pelvis was performed using the standard protocol following bolus administration of intravenous contrast.  CONTRAST:  75mL  OMNIPAQUE IOHEXOL 300 MG/ML  SOLN  COMPARISON:  None.  FINDINGS: Dependent changes in the lung bases.  Gallbladder is not visualized and likely contracted or surgically absent. Calcified granulomas in the spleen. No bile duct dilatation. The liver, pancreas, adrenal glands, abdominal aorta, inferior vena cava, and retroperitoneal lymph nodes are unremarkable. Small cysts in the kidneys. No hydronephrosis. Renal nephrograms are symmetrical. Left lower quadrant descending colostomy with peristomal hernia containing fat. Postoperative anterior abdominal wall mesh hernia repair. Right mid abdominal anterior wall hernia containing fat and bowel, likely transverse colon. Distal herniated loop is decompressed suggesting possible partial obstruction. Infiltration in the herniated fat may indicate fat necrosis. No bowel pneumatosis or portal venous gas. No free air or free fluid in the abdomen.  Pelvis: Uterus and ovaries are not enlarged. Appendix is not identified. Bladder wall is not thickened. No free or loculated pelvic fluid collections. No pelvic mass or lymphadenopathy.  IMPRESSION: Left lower quadrant descending colostomy with peristomal hernia containing fat. Right anterior mid abdominal wall hernia containing bowel, probably transverse colon. Suggestion some degree of obstruction and possible fat necrosis associated with the hernia. No findings suggesting bowel necrosis.   Electronically Signed   By: Burman Nieves M.D.   On: 03/15/2015 01:10   Dg Chest Port 1 View  03/14/2015   CLINICAL DATA:  Abdominal pain and emesis with severe pain for a few hours.  EXAM: PORTABLE  CHEST 1 VIEW  COMPARISON:  01/24/2012  FINDINGS: The heart size and mediastinal contours are within normal limits. Both lungs are clear. The visualized skeletal structures are unremarkable.  IMPRESSION: No active disease.   Electronically Signed   By: Burman Nieves M.D.   On: 03/14/2015 23:57    Assessment/Plan: 49 year old female status  post surgical repair of incarcerated hernias. Called with a critical value of an elevated lactate. Patient checked and found to be doing well. We'll recheck lab at a 6 hour interval to ensure resolution. Continue current liquid diet and anticipate continued clinical improvement.  Ricarda Frame, MD FACS General Surgeon [facility] Surgical

## 2015-03-15 NOTE — Anesthesia Procedure Notes (Signed)
Procedure Name: Intubation Date/Time: 03/15/2015 4:14 AM Performed by: Sherron Flemings Pre-anesthesia Checklist: Patient identified, Patient being monitored, Timeout performed, Emergency Drugs available and Suction available Patient Re-evaluated:Patient Re-evaluated prior to inductionOxygen Delivery Method: Circle system utilized Preoxygenation: Pre-oxygenation with 100% oxygen Intubation Type: IV induction Ventilation: Mask ventilation without difficulty Laryngoscope Size: Mac and 3 Grade View: Grade II Tube type: Oral Tube size: 7.0 mm Number of attempts: 1 Placement Confirmation: ETT inserted through vocal cords under direct vision,  positive ETCO2 and breath sounds checked- equal and bilateral Secured at: 21 cm Tube secured with: Tape Dental Injury: Teeth and Oropharynx as per pre-operative assessment

## 2015-03-15 NOTE — Anesthesia Preprocedure Evaluation (Addendum)
Anesthesia Evaluation  Patient identified by MRN, date of birth, ID band Patient awake    Reviewed: Allergy & Precautions, NPO status , Patient's Chart, lab work & pertinent test results  History of Anesthesia Complications Negative for: history of anesthetic complications  Airway Mallampati: III       Dental  (+) Partial Lower, Partial Upper   Pulmonary neg pulmonary ROS,           Cardiovascular negative cardio ROS  + Valvular Problems/Murmurs (murmur as a child)      Neuro/Psych  Neuromuscular disease (hirshcprungs dx) negative neurological ROS     GI/Hepatic Neg liver ROS, GERD  Medicated and Poorly Controlled,  Endo/Other  Hypothyroidism   Renal/GU negative Renal ROS     Musculoskeletal   Abdominal   Peds  Hematology negative hematology ROS (+)   Anesthesia Other Findings   Reproductive/Obstetrics                            Anesthesia Physical Anesthesia Plan  ASA: III  Anesthesia Plan: General   Post-op Pain Management:    Induction: Intravenous  Airway Management Planned: Oral ETT  Additional Equipment:   Intra-op Plan:   Post-operative Plan:   Informed Consent: I have reviewed the patients History and Physical, chart, labs and discussed the procedure including the risks, benefits and alternatives for the proposed anesthesia with the patient or authorized representative who has indicated his/her understanding and acceptance.     Plan Discussed with:   Anesthesia Plan Comments:         Anesthesia Quick Evaluation

## 2015-03-15 NOTE — Progress Notes (Signed)
Called Dr. Tonita Cong to report Lactic acid of 3.4. No new orders given.

## 2015-03-15 NOTE — Brief Op Note (Signed)
03/14/2015 - 03/15/2015  5:57 AM  PATIENT:  Humberto Seals  49 y.o. female  PRE-OPERATIVE DIAGNOSIS:  incarcerated ventral hernia  POST-OPERATIVE DIAGNOSIS:  INCARCERATED VENTRAL HERNIA   PROCEDURE:  Procedure(s): VENTRAL HERNIA REPAIR FOR INCARCERATED VENTRAL HERNIA  (N/A)  SURGEON:  Surgeon(s) and Role:    * Ida Rogue, MD - Primary  PHYSICIAN ASSISTANT:   ASSISTANTS: none   ANESTHESIA:   general  EBL:  Total I/O In: 1700 [I.V.:1700] Out: - 25 ml  BLOOD ADMINISTERED:none  DRAINS: (1) Jackson-Pratt drain(s) with closed bulb suction in the subcutaneous tissue   LOCAL MEDICATIONS USED:  NONE  SPECIMEN:  No Specimen  DISPOSITION OF SPECIMEN:  N/A  COUNTS:  YES  TOURNIQUET:  * No tourniquets in log *  DICTATION: .Note written in EPIC  PLAN OF CARE: Admit to inpatient   PATIENT DISPOSITION:  PACU - hemodynamically stable.   Delay start of Pharmacological VTE agent (>24hrs) due to surgical blood loss or risk of bleeding: no

## 2015-03-16 ENCOUNTER — Encounter: Payer: Self-pay | Admitting: Surgery

## 2015-03-16 LAB — CBC
HEMATOCRIT: 33.2 % — AB (ref 35.0–47.0)
HEMOGLOBIN: 11.3 g/dL — AB (ref 12.0–16.0)
MCH: 31.4 pg (ref 26.0–34.0)
MCHC: 34 g/dL (ref 32.0–36.0)
MCV: 92.3 fL (ref 80.0–100.0)
Platelets: 265 10*3/uL (ref 150–440)
RBC: 3.59 MIL/uL — ABNORMAL LOW (ref 3.80–5.20)
RDW: 13.5 % (ref 11.5–14.5)
WBC: 22.1 10*3/uL — ABNORMAL HIGH (ref 3.6–11.0)

## 2015-03-16 LAB — BASIC METABOLIC PANEL
Anion gap: 4 — ABNORMAL LOW (ref 5–15)
BUN: 8 mg/dL (ref 6–20)
CHLORIDE: 110 mmol/L (ref 101–111)
CO2: 26 mmol/L (ref 22–32)
CREATININE: 0.7 mg/dL (ref 0.44–1.00)
Calcium: 8.2 mg/dL — ABNORMAL LOW (ref 8.9–10.3)
GFR calc Af Amer: >60 mL/min (ref >60)
GFR calc non Af Amer: >60 mL/min (ref >60)
GLUCOSE: 90 mg/dL (ref 65–99)
Potassium: 3.6 mmol/L (ref 3.5–5.1)
SODIUM: 140 mmol/L (ref 135–145)

## 2015-03-16 LAB — LACTIC ACID, PLASMA: Lactic Acid, Venous: 1.5 mmol/L (ref 0.5–2.0)

## 2015-03-16 MED ORDER — ACETAMINOPHEN 160 MG/5ML PO SOLN: 325.0000 mg | ORAL | Status: DC | PRN

## 2015-03-16 MED ORDER — OXYCODONE HCL 5 MG/5ML PO SOLN
5.0000 mg | ORAL | Status: DC | PRN
Start: 2015-03-16 — End: 2015-03-20

## 2015-03-16 NOTE — Discharge Summary (Signed)
Physician Discharge Summary  Patient ID: Lauren Nixon MRN: [id number] DOB/AGE: October 03, 1965 49 y.o.  Admit date: 03/14/2015 Discharge date: 03/16/2015  Admission Diagnoses:  Discharge Diagnoses:  Active Problems:   Partial bowel obstruction   Ventral hernia with obstruction and without gangrene   Incarcerated ventral hernia   Discharged Condition: good  Hospital Course: Lauren Nixon was brought to surgery for incarcerated ventral hernia.  Postoperatively she was began on clear liquid diet with IV pain control.  As her appetite improved, her diet was advanced and she was converted to PO pain medications.  At time of discharge, she was tolerating a regular diet with good oral pain control.    Significant Diagnostic Studies:CT  Treatments: Open ventral hernia repair 9/25  Discharge Exam: Blood pressure 99/60, pulse 98, temperature 98.2 F (36.8 C), temperature source Oral, resp. rate 17, height 4' (1.219 m), weight 128 lb 9.6 oz (58.333 kg), SpO2 98 %. General appearance: alert, oriented x 3, no acute distress Abdomen: soft, nondistended, appropriately tender, nondistended, incision c/d/i with serosangous JP output  Disposition:   Discharge Instructions    Discharge instructions    Complete by:  As directed   During your recent anesthetic, you were given the medication sugammadex (Bridion). This medication interacts with hormonal forms of birth control (oral contraceptives and injected or implanted birth control) and may make them ineffective. IFYOU USE ANY HORMONAL FORM OF BIRTH CONTROL, YOU MUST USE AN ADDITIONAL BARRIER BIRTH CONTROL FOR METHOD FOR SEVEN DAYS after receiving sugammadex (Bridion) or there is a chance you could become pregnant.            Medication List    ASK your doctor about these medications        aspirin 81 MG chewable tablet  Chew 1 tablet by mouth daily.     esomeprazole 40 MG capsule  Commonly known as:  NEXIUM  Take 1 capsule by mouth daily.      levothyroxine 75 MCG tablet  Commonly known as:  SYNTHROID, LEVOTHROID  Take 0.5 tablets by mouth daily.     loratadine 10 MG tablet  Commonly known as:  CLARITIN  Take 10 mg by mouth daily.     Melatonin 3 MG Tabs  Take 1 tablet by mouth at bedtime.           Follow-up Information    Follow up with [facility]. Schedule an appointment as soon as possible for a visit in 1 week.      SignedIda Nixon 03/16/2015, 2:50 AM

## 2015-03-16 NOTE — Progress Notes (Signed)
Surgery Progress Note  S: Doing well.  Hungry.  No pain O:Blood pressure 97/66, pulse 92, temperature 97.6 F (36.4 C), temperature source Oral, resp. rate 17, height 4' (1.219 m), weight 128 lb 9.6 oz (58.333 kg), SpO2 99 %. GEN: NAD/A&Ox3 ABD: soft, min tender, nondistended, dressing c/d/i, ostomy with stool  WBC 22 Lactate 1.5  A/P 49 yo s/p VHR for incarceration, doing well - regular diet - possible PO pain meds later if tolerates diet

## 2015-03-16 NOTE — Progress Notes (Signed)
Pt A and O x 4. VSS. Pt tolerating diet well. No complaints of pain or nausea. Dressings clean dry and intact. IV removed intact, prescriptions given. Discharge instructions explained to patients sister, who is her caregiver. Pt discharged via wheelchair with nurse aide.

## 2015-03-16 NOTE — Progress Notes (Signed)
Patient feels well as tolerating a diet as been up and walking. She has no complaints at this time.  She and family want to be discharged this afternoon and I see no reason not to. He'll be sent home with oral analgesia 6 with dressing instructions etc. and follow-up on Friday for drain removal.

## 2015-03-16 NOTE — Progress Notes (Signed)
Patient seen and examined. I discussed her care with Dr. Juliann Pulse. Patient's daughter is present as well.   She has no complaints at this time  Abdomen is soft and nontender wound is healing well drain in place. nOn tender calves  Patient doing well at this point she may be able to go home this afternoon has multiple family members able to care for her. See her later this afternoon and make that decision.

## 2015-03-16 NOTE — Discharge Instructions (Signed)
Do not drive on pain medications Do not lift greater than 15 lbs for a period of 6 weeks Call or return to ER if you develop fever greater than 101.5, nausea/vomiting, increased pain, redness/drainage from incisions Take bandages off in 48 hours.  Okay to shower with bandages on or after they come off, no tub baths Record output from drain and bring with you to next appointment

## 2015-03-16 NOTE — Care Management CHF Note (Signed)
Spoke with patient guardian MS Taylor(Sister) via telephone. Patient has all equipment and resources available in the home. Resides with sister. No CM needs identified , contact information given to MS [facility].

## 2015-03-18 ENCOUNTER — Other Ambulatory Visit: Payer: Self-pay | Admitting: *Deleted

## 2015-03-18 ENCOUNTER — Encounter: Payer: Self-pay | Admitting: *Deleted

## 2015-03-19 ENCOUNTER — Telehealth: Payer: Self-pay | Admitting: Surgery

## 2015-03-19 DIAGNOSIS — E079 Disorder of thyroid, unspecified: Secondary | ICD-10-CM | POA: Insufficient documentation

## 2015-03-19 DIAGNOSIS — Q909 Down syndrome, unspecified: Secondary | ICD-10-CM | POA: Insufficient documentation

## 2015-03-19 NOTE — Telephone Encounter (Signed)
Spoke with patient's sister at this time. She explains that patient is fatigued more so today than she has been and is having no output through her colostomy (very abnormal for this patient per sister). Denies fever, chills, nausea, or vomiting.  I consulted with Dr. Tonita Cong regarding patient.  We will see patient at scheduled appointment tomorrow. However, if patient begins to have Nausea, vomiting, worsening abdominal pain, fever, or chills she need to go to emergency room over night tonight. Boyd Kerbs (sister) verbalizes understanding of this information.

## 2015-03-19 NOTE — Telephone Encounter (Signed)
Good breakfast  Good lunch Has appt. In the morning with Dr. Tonita Cong but nothing has come through the colostomy bag. Dr. Juliann Pulse did hernia surgery. Please call today.

## 2015-03-20 ENCOUNTER — Encounter: Payer: Self-pay | Admitting: General Surgery

## 2015-03-20 ENCOUNTER — Other Ambulatory Visit
Admission: RE | Admit: 2015-03-20 | Discharge: 2015-03-20 | Disposition: A | Discharge status: Home / Self Care | Payer: Medicare HMO | Source: Ambulatory Visit | Attending: General Surgery | Admitting: General Surgery

## 2015-03-20 ENCOUNTER — Ambulatory Visit (INDEPENDENT_AMBULATORY_CARE_PROVIDER_SITE_OTHER): Payer: Medicare HMO | Admitting: General Surgery

## 2015-03-20 VITALS — BP 93/64 | HR 81 | Temp 97.8°F | Ht <= 58 in | Wt 118.0 lb

## 2015-03-20 DIAGNOSIS — Z4889 Encounter for other specified surgical aftercare: Secondary | ICD-10-CM

## 2015-03-20 DIAGNOSIS — R1030 Lower abdominal pain, unspecified: Secondary | ICD-10-CM

## 2015-03-20 LAB — URINALYSIS COMPLETE WITH MICROSCOPIC (ARMC ONLY)
Bilirubin Urine: NEGATIVE
Glucose, UA: NEGATIVE mg/dL
KETONES UR: NEGATIVE mg/dL
Leukocytes, UA: NEGATIVE
NITRITE: NEGATIVE
PROTEIN: NEGATIVE mg/dL
SPECIFIC GRAVITY, URINE: 1.005 (ref 1.005–1.030)
pH: 7 (ref 5.0–8.0)

## 2015-03-20 NOTE — Patient Instructions (Signed)
Please give [place] a call if you have any questions or concerns. After we get the results for your urine, we will call you to let you know if you need antibiotics or not.

## 2015-03-20 NOTE — Progress Notes (Signed)
Outpatient Surgical Follow Up  03/20/2015  Lauren Nixon is an 49 y.o. female.   Chief Complaint  Patient presents with  . Routine Post Op    Hernia Repair Dr. Juliann Pulse 03/15/2015    HPI: 49 year old female returns to clinic 5 days postop status post repair of incarcerated peristomal hernia. Patient reports that she has done well and is only required Tylenol for pain. She had a scare with no ostomy output for approximately 24 hours however this resolved last night. She's having normal output now. She denies any fevers, chills, nausea, vomiting. She also had a JP drain in place and a right lower quadrant with decreasing output. Last 24 hours has had less then half an ounce. She does complain of some burning when she urinates and desires to have it evaluated.  Past Medical History  Diagnosis Date  . Thyroid disease   . GERD (gastroesophageal reflux disease)   . Hirschsprung disease   . Ventral hernia     Past Surgical History  Procedure Laterality Date  . Colostomy    . Ventral hernia repair N/A 03/15/2015    Procedure: VENTRAL HERNIA REPAIR FOR INCARCERATED VENTRAL HERNIA ;  Surgeon: Ida Rogue, MD;  Location: [facility];  Service: General;  Laterality: N/A;    History reviewed. No pertinent family history.  Social History:  reports that she has never smoked. She does not have any smokeless tobacco history on file. She reports that she does not drink alcohol or use illicit drugs.  Allergies:  Allergies  Allergen Reactions  . Penicillins Other (See Comments)    Childhood allergy- sister does not know details    Medications reviewed.    ROS A multipoint review of systems was completed. All pertinent positives negatives within the history of present illness remainder were negative.   BP 93/64 mmHg  Pulse 81  Temp(Src) 97.8 F (36.6 C) (Oral)  Ht 4' (1.219 m)  Wt 53.524 kg (118 lb)  BMI 36.02 kg/m2  Physical Exam  Gen.: No acute distress Chest: Regular  rate and rhythm and clear to auscultation Abdomen: Soft, nontender, nondistended. JP in place in right lower quadrant with minimal serosanguineous output in the drain. Staples in place the midline without any evidence of infection or drainage. No palpable hernia at this time.   No results found for this or any previous visit (from the past 48 hour(s)). No results found.  Assessment/Plan:  1. Lower abdominal pain Burning with urination. We'll obtain urinalysis and treat should he show signs of infection.  - Urinalysis w microscopic + reflex cultur; Future  2. Aftercare following surgery Patient doing well status post hernia repair. JP drain removed today in clinic without any difficulty. We'll plan to have patient return to clinic in 1 week for staple removal.     Ricarda Frame  03/20/2015,negative

## 2015-03-26 ENCOUNTER — Encounter: Payer: Self-pay | Admitting: Surgery

## 2015-03-26 ENCOUNTER — Ambulatory Visit (INDEPENDENT_AMBULATORY_CARE_PROVIDER_SITE_OTHER): Payer: Medicare HMO | Admitting: Surgery

## 2015-03-26 VITALS — BP 98/66 | HR 76 | Temp 98.2°F | Ht <= 58 in | Wt 120.8 lb

## 2015-03-26 DIAGNOSIS — K439 Ventral hernia without obstruction or gangrene: Secondary | ICD-10-CM

## 2015-03-26 NOTE — Progress Notes (Signed)
Outpatient postop visit  03/26/2015  Lauren Nixon is an 49 y.o. female.    Procedure: Ventral hernia repair  CC: Minimal if any pain  HPI: Patient is doing very well without much pain at all no nausea or vomiting and no drainage from her wounds  Medications reviewed.    Physical Exam:  BP 98/66 mmHg  Pulse 76  Temp(Src) 98.2 F (36.8 C) (Oral)  Ht 4' (1.219 m)  Wt 120 lb 12.8 oz (54.795 kg)  BMI 36.88 kg/m2    PE: Comfortable-appearing soft nontender abdomen wounds healing well no erythema no drainage staples removed Steri-Strips and benzoin placed    Assessment/Plan:  Patient doing very well recommend follow up with Dr. Juliann Pulse next week for last visit. Otherwise she is doing quite well.  Lattie Haw, MD, FACS

## 2015-03-26 NOTE — Patient Instructions (Signed)
We have removed your staples today and placed steri-strips. You can shower normally, just leave the strips in place.  We will see you back in the office in 1 week with Dr. Juliann Pulse.

## 2015-04-01 ENCOUNTER — Ambulatory Visit (INDEPENDENT_AMBULATORY_CARE_PROVIDER_SITE_OTHER): Payer: Medicare HMO | Admitting: Surgery

## 2015-04-01 ENCOUNTER — Encounter: Payer: Self-pay | Admitting: Surgery

## 2015-04-01 VITALS — BP 95/67 | HR 80 | Temp 97.8°F | Ht <= 58 in | Wt 120.2 lb

## 2015-04-01 DIAGNOSIS — Z09 Encounter for follow-up examination after completed treatment for conditions other than malignant neoplasm: Secondary | ICD-10-CM

## 2015-04-01 NOTE — Progress Notes (Signed)
Surgery Progress Note  S: No acute issues O:Blood pressure 95/67, pulse 80, temperature 97.8 F (36.6 C), temperature source Oral, height 4\' 8"  (1.422 m), weight 120 lb 3.2 oz (54.522 kg). GEN: NAD/A&Ox3 ABD: soft, min tender, nondistended  A/P 49 yo s/p VHR for incarcerated VH, doing well - f/u prn.

## 2015-04-01 NOTE — Patient Instructions (Signed)
Do not lift greater than 15 lbs for a period of 6 weeks Call or return to ER if you develop fever greater than 101.5, nausea/vomiting, increased pain, redness/drainage from incisions  

## 2015-06-11 DIAGNOSIS — Z933 Colostomy status: Secondary | ICD-10-CM | POA: Diagnosis not present

## 2015-07-16 DIAGNOSIS — H2513 Age-related nuclear cataract, bilateral: Secondary | ICD-10-CM | POA: Diagnosis not present

## 2015-08-13 DIAGNOSIS — Z933 Colostomy status: Secondary | ICD-10-CM | POA: Diagnosis not present

## 2015-09-18 DIAGNOSIS — Z933 Colostomy status: Secondary | ICD-10-CM | POA: Diagnosis not present

## 2015-09-25 DIAGNOSIS — E7800 Pure hypercholesterolemia, unspecified: Secondary | ICD-10-CM | POA: Diagnosis not present

## 2015-09-25 DIAGNOSIS — E039 Hypothyroidism, unspecified: Secondary | ICD-10-CM | POA: Diagnosis not present

## 2015-09-25 DIAGNOSIS — Z79899 Other long term (current) drug therapy: Secondary | ICD-10-CM | POA: Diagnosis not present

## 2015-09-29 ENCOUNTER — Other Ambulatory Visit: Payer: Self-pay | Admitting: Family Medicine

## 2015-09-29 DIAGNOSIS — E039 Hypothyroidism, unspecified: Secondary | ICD-10-CM | POA: Diagnosis not present

## 2015-09-29 DIAGNOSIS — E782 Mixed hyperlipidemia: Secondary | ICD-10-CM | POA: Diagnosis not present

## 2015-09-29 DIAGNOSIS — Z1231 Encounter for screening mammogram for malignant neoplasm of breast: Secondary | ICD-10-CM

## 2015-09-29 DIAGNOSIS — Z79899 Other long term (current) drug therapy: Secondary | ICD-10-CM | POA: Diagnosis not present

## 2015-09-29 DIAGNOSIS — Z0000 Encounter for general adult medical examination without abnormal findings: Secondary | ICD-10-CM | POA: Diagnosis not present

## 2015-10-13 ENCOUNTER — Ambulatory Visit: Payer: Commercial Managed Care - HMO

## 2015-10-20 ENCOUNTER — Ambulatory Visit: Payer: Commercial Managed Care - HMO

## 2015-11-18 DIAGNOSIS — Z933 Colostomy status: Secondary | ICD-10-CM | POA: Diagnosis not present

## 2015-11-24 ENCOUNTER — Ambulatory Visit
Admission: RE | Admit: 2015-11-24 | Discharge: 2015-11-24 | Disposition: A | Discharge status: Home / Self Care | Payer: Medicare HMO | Source: Ambulatory Visit | Attending: Family Medicine | Admitting: Family Medicine

## 2015-11-24 DIAGNOSIS — Z1231 Encounter for screening mammogram for malignant neoplasm of breast: Secondary | ICD-10-CM | POA: Diagnosis not present

## 2015-12-25 DIAGNOSIS — K921 Melena: Secondary | ICD-10-CM | POA: Diagnosis not present

## 2016-01-13 ENCOUNTER — Ambulatory Visit (INDEPENDENT_AMBULATORY_CARE_PROVIDER_SITE_OTHER): Payer: Medicare HMO | Admitting: Surgery

## 2016-01-13 ENCOUNTER — Encounter: Payer: Self-pay | Admitting: Surgery

## 2016-01-13 VITALS — BP 96/63 | HR 73 | Temp 98.1°F | Wt 116.0 lb

## 2016-01-13 DIAGNOSIS — K435 Parastomal hernia without obstruction or  gangrene: Secondary | ICD-10-CM | POA: Diagnosis not present

## 2016-01-13 DIAGNOSIS — K922 Gastrointestinal hemorrhage, unspecified: Secondary | ICD-10-CM | POA: Diagnosis not present

## 2016-01-13 NOTE — Progress Notes (Signed)
Patient ID: [name], female   DOB: February 06, 1966, 50 y.o.   MRN: [id number]  HPI Lauren Nixon is a 50 y.o. female in consultation for 2 episodes of bright red blood through her colostomy. Patient has a history of Down syndrome and a history of Hirschsprung's disease. Multiple abdominal operations in the past including colectomy and an end colostomy. Most recent surgery was performed by Dr. Sharmon Revere on September 2016 for an incarcerated ventral hernia. She has done well since then. She denies specifically any abdominal pain, denies any nausea and vomiting. She is accompanied by her sister who is the caregiver and she reports that she'll has only had 2 episodes of bright red blood bleeding and only about 1 ounce in each case. She reports that after she stopped the and states and aspirin she has not had any more episodes. There is some concern about a right bulging on her abdominal wall. She now has a component of dementia in addition to her Down syndrome  HPI  Past Medical History:  Diagnosis Date  . GERD (gastroesophageal reflux disease)   . Hirschsprung disease   . Thyroid disease   . Ventral hernia     Past Surgical History:  Procedure Laterality Date  . COLOSTOMY    . VENTRAL HERNIA REPAIR N/A 03/15/2015   Procedure: VENTRAL HERNIA REPAIR FOR INCARCERATED VENTRAL HERNIA ;  Surgeon: Ida Rogue, MD;  Location: [facility];  Service: General;  Laterality: N/A;    Family History  Problem Relation Age of Onset  . Hypertension Father     Social History Social History  Substance Use Topics  . Smoking status: Never Smoker  . Smokeless tobacco: Never Used  . Alcohol use No    Allergies  Allergen Reactions  . Penicillins Other (See Comments)    Childhood allergy- sister does not know details    Current Outpatient Prescriptions  Medication Sig Dispense Refill  . Calcium-Phosphorus-Vitamin D (CALCIUM/VITAMIN D3/ADULT GUMMY PO) Take 1 tablet by mouth 1 day or 1 dose.     . esomeprazole (NEXIUM) 40 MG capsule Take 1 capsule by mouth 2 (two) times daily before a meal.     . fluticasone (FLONASE) 50 MCG/ACT nasal spray     . GINKGO BILOBA EXTRACT PO Take 1 tablet by mouth 1 day or 1 dose.    . levothyroxine (SYNTHROID, LEVOTHROID) 75 MCG tablet Take 0.5 tablets by mouth daily.    Lauren Nixon loratadine (CLARITIN) 10 MG tablet Take 10 mg by mouth daily.    . Melatonin 3 MG TABS Take 1 tablet by mouth at bedtime.    . Multiple Vitamin (MULTI-VITAMINS) TABS Take by mouth.     No current facility-administered medications for this visit.      Review of Systems A 10 point review of systems was asked and was negative except for the information on the HPI  Physical Exam Blood pressure 96/63, pulse 73, temperature 98.1 F (36.7 C), temperature source Oral, weight 52.6 kg (116 lb). CONSTITUTIONAL:  NAD , well nourished EYES: Pupils are equal, round, and reactive to light, Sclera are non-icteric. EARS, NOSE, MOUTH AND THROAT: The oropharynx is clear. The oral mucosa is pink and moist. Hearing is intact to voice. LYMPH NODES:  Lymph nodes in the neck are normal.  GI: The abdomen is soft, nontender, and nondistended. There are no palpable masses. 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 This is asymptomatic. An midline incision healing well without  evidence of recurrent ventral hernia. No peritonitis GU: Rectal deferred.   MUSCULOSKELETAL: Normal muscle strength and tone. No cyanosis or edema.   SKIN: Turgor is good and there are no pathologic skin lesions or ulcers. NEUROLOGIC: Motor and sensation is grossly normal. Cranial nerves are grossly intact. PSYCH:  Oriented to person, place and time. Affect is normal.  Data Reviewed I have personally reviewed the patient's imaging, laboratory findings and medical records.    Assessment/Plan  Resolving episodes of lower GI bleed likely from the bag irritating the  colonic mucosa  At this time  there is no further episodes. Discussed with the sister in detail who is the caregiver about options. One option would be to pursue aggressive management and workup including an upper and lower colonoscopy to determine the source. And they wish to wait and they don't want to be very aggressive at this time. Also discussed with them about the parastomal hernia and that this is a difficult problem.  Sterling Big, MD FACS General Surgeon 01/13/2016, 3:15 PM

## 2016-01-19 DIAGNOSIS — Z933 Colostomy status: Secondary | ICD-10-CM | POA: Diagnosis not present

## 2016-02-19 DIAGNOSIS — Z933 Colostomy status: Secondary | ICD-10-CM | POA: Diagnosis not present

## 2016-03-24 DIAGNOSIS — E782 Mixed hyperlipidemia: Secondary | ICD-10-CM | POA: Diagnosis not present

## 2016-03-24 DIAGNOSIS — Z79899 Other long term (current) drug therapy: Secondary | ICD-10-CM | POA: Diagnosis not present

## 2016-03-24 DIAGNOSIS — E039 Hypothyroidism, unspecified: Secondary | ICD-10-CM | POA: Diagnosis not present

## 2016-03-25 DIAGNOSIS — Z933 Colostomy status: Secondary | ICD-10-CM | POA: Diagnosis not present

## 2016-03-29 DIAGNOSIS — Z23 Encounter for immunization: Secondary | ICD-10-CM | POA: Diagnosis not present

## 2016-03-29 DIAGNOSIS — E039 Hypothyroidism, unspecified: Secondary | ICD-10-CM | POA: Diagnosis not present

## 2016-03-29 DIAGNOSIS — Z79899 Other long term (current) drug therapy: Secondary | ICD-10-CM | POA: Diagnosis not present

## 2016-03-29 DIAGNOSIS — E7800 Pure hypercholesterolemia, unspecified: Secondary | ICD-10-CM | POA: Insufficient documentation

## 2016-03-29 DIAGNOSIS — K219 Gastro-esophageal reflux disease without esophagitis: Secondary | ICD-10-CM | POA: Diagnosis not present

## 2016-03-29 DIAGNOSIS — J301 Allergic rhinitis due to pollen: Secondary | ICD-10-CM | POA: Diagnosis not present

## 2016-03-29 DIAGNOSIS — B353 Tinea pedis: Secondary | ICD-10-CM | POA: Diagnosis not present

## 2016-04-15 DIAGNOSIS — M25562 Pain in left knee: Secondary | ICD-10-CM | POA: Diagnosis not present

## 2016-04-19 ENCOUNTER — Emergency Department: Payer: Commercial Managed Care - HMO

## 2016-04-19 ENCOUNTER — Encounter: Payer: Self-pay | Admitting: Emergency Medicine

## 2016-04-19 ENCOUNTER — Inpatient Hospital Stay
Admission: EM | Admit: 2016-04-19 | Discharge: 2016-04-20 | DRG: 195 | Disposition: A | Discharge status: Home Health | Payer: Commercial Managed Care - HMO | Attending: Internal Medicine | Admitting: Internal Medicine

## 2016-04-19 DIAGNOSIS — Z79899 Other long term (current) drug therapy: Secondary | ICD-10-CM | POA: Diagnosis not present

## 2016-04-19 DIAGNOSIS — Z7982 Long term (current) use of aspirin: Secondary | ICD-10-CM

## 2016-04-19 DIAGNOSIS — K219 Gastro-esophageal reflux disease without esophagitis: Secondary | ICD-10-CM | POA: Diagnosis present

## 2016-04-19 DIAGNOSIS — E039 Hypothyroidism, unspecified: Secondary | ICD-10-CM | POA: Diagnosis present

## 2016-04-19 DIAGNOSIS — R079 Chest pain, unspecified: Secondary | ICD-10-CM | POA: Diagnosis not present

## 2016-04-19 DIAGNOSIS — J168 Pneumonia due to other specified infectious organisms: Secondary | ICD-10-CM | POA: Diagnosis not present

## 2016-04-19 DIAGNOSIS — R109 Unspecified abdominal pain: Secondary | ICD-10-CM | POA: Diagnosis not present

## 2016-04-19 DIAGNOSIS — D72829 Elevated white blood cell count, unspecified: Secondary | ICD-10-CM | POA: Diagnosis not present

## 2016-04-19 DIAGNOSIS — J181 Lobar pneumonia, unspecified organism: Secondary | ICD-10-CM

## 2016-04-19 DIAGNOSIS — Z880 Allergy status to penicillin: Secondary | ICD-10-CM

## 2016-04-19 DIAGNOSIS — Z7951 Long term (current) use of inhaled steroids: Secondary | ICD-10-CM

## 2016-04-19 DIAGNOSIS — R110 Nausea: Secondary | ICD-10-CM | POA: Diagnosis not present

## 2016-04-19 DIAGNOSIS — R918 Other nonspecific abnormal finding of lung field: Secondary | ICD-10-CM | POA: Diagnosis not present

## 2016-04-19 DIAGNOSIS — F0390 Unspecified dementia without behavioral disturbance: Secondary | ICD-10-CM | POA: Diagnosis not present

## 2016-04-19 DIAGNOSIS — Z933 Colostomy status: Secondary | ICD-10-CM

## 2016-04-19 DIAGNOSIS — Q909 Down syndrome, unspecified: Secondary | ICD-10-CM

## 2016-04-19 DIAGNOSIS — R262 Difficulty in walking, not elsewhere classified: Secondary | ICD-10-CM

## 2016-04-19 DIAGNOSIS — J189 Pneumonia, unspecified organism: Principal | ICD-10-CM | POA: Diagnosis present

## 2016-04-19 DIAGNOSIS — M6281 Muscle weakness (generalized): Secondary | ICD-10-CM

## 2016-04-19 DIAGNOSIS — R531 Weakness: Secondary | ICD-10-CM | POA: Diagnosis not present

## 2016-04-19 HISTORY — DX: Down syndrome, unspecified: Q90.9

## 2016-04-19 LAB — COMPREHENSIVE METABOLIC PANEL
ALBUMIN: 4.1 g/dL (ref 3.5–5.0)
ALK PHOS: 73 U/L (ref 38–126)
ALT: 20 U/L (ref 14–54)
ANION GAP: 13 (ref 5–15)
AST: 40 U/L (ref 15–41)
BUN: 13 mg/dL (ref 6–20)
CALCIUM: 9.2 mg/dL (ref 8.9–10.3)
CHLORIDE: 100 mmol/L — AB (ref 101–111)
CO2: 23 mmol/L (ref 22–32)
Creatinine, Ser: 0.94 mg/dL (ref 0.44–1.00)
GFR calc Af Amer: >60 mL/min (ref >60)
GFR calc non Af Amer: >60 mL/min (ref >60)
GLUCOSE: 150 mg/dL — AB (ref 65–99)
Potassium: 3.5 mmol/L (ref 3.5–5.1)
SODIUM: 136 mmol/L (ref 135–145)
Total Bilirubin: 0.2 mg/dL — ABNORMAL LOW (ref 0.3–1.2)
Total Protein: 8.4 g/dL — ABNORMAL HIGH (ref 6.5–8.1)

## 2016-04-19 LAB — URINALYSIS COMPLETE WITH MICROSCOPIC (ARMC ONLY)
BACTERIA UA: NONE SEEN
Bilirubin Urine: NEGATIVE
GLUCOSE, UA: NEGATIVE mg/dL
Ketones, ur: NEGATIVE mg/dL
Nitrite: NEGATIVE
PH: 5 (ref 5.0–8.0)
PROTEIN: NEGATIVE mg/dL
SPECIFIC GRAVITY, URINE: 1.018 (ref 1.005–1.030)

## 2016-04-19 LAB — CBC
HCT: 42.7 % (ref 35.0–47.0)
HEMOGLOBIN: 14.7 g/dL (ref 12.0–16.0)
MCH: 31.7 pg (ref 26.0–34.0)
MCHC: 34.3 g/dL (ref 32.0–36.0)
MCV: 92.4 fL (ref 80.0–100.0)
Platelets: 340 10*3/uL (ref 150–440)
RBC: 4.62 MIL/uL (ref 3.80–5.20)
RDW: 13.4 % (ref 11.5–14.5)
WBC: 20.2 10*3/uL — ABNORMAL HIGH (ref 3.6–11.0)

## 2016-04-19 LAB — TROPONIN I: Troponin I: <0.03 ng/mL (ref <0.03)

## 2016-04-19 LAB — LIPASE, BLOOD: Lipase: 21 U/L (ref 11–51)

## 2016-04-19 LAB — STREP PNEUMONIAE URINARY ANTIGEN: Strep Pneumo Urinary Antigen: NEGATIVE

## 2016-04-19 MED ORDER — SODIUM CHLORIDE 0.9 % IV BOLUS (SEPSIS)
1000.0000 mL | Freq: Once | INTRAVENOUS | Status: AC
  Administered 2016-04-19: 1000 mL via INTRAVENOUS

## 2016-04-19 MED ORDER — ACETAMINOPHEN 325 MG PO TABS
650.0000 mg | ORAL_TABLET | Freq: Four times a day (QID) | ORAL | Status: DC | PRN
  Administered 2016-04-20 (×2): 650 mg via ORAL
  Filled 2016-04-19 (×2): qty 2

## 2016-04-19 MED ORDER — DEXTROSE 5 % IV SOLN
2.0000 g | Freq: Once | INTRAVENOUS | Status: AC
  Administered 2016-04-19: 2 g via INTRAVENOUS
  Filled 2016-04-19: qty 2

## 2016-04-19 MED ORDER — ONDANSETRON HCL 4 MG/2ML IJ SOLN
4.0000 mg | Freq: Once | INTRAMUSCULAR | Status: AC
  Administered 2016-04-19: 4 mg via INTRAVENOUS
  Filled 2016-04-19: qty 2

## 2016-04-19 MED ORDER — METOCLOPRAMIDE HCL 5 MG/ML IJ SOLN
10.0000 mg | Freq: Once | INTRAMUSCULAR | Status: AC
  Administered 2016-04-19: 10 mg via INTRAVENOUS
  Filled 2016-04-19: qty 2

## 2016-04-19 MED ORDER — PANTOPRAZOLE SODIUM 40 MG PO TBEC
40.0000 mg | DELAYED_RELEASE_TABLET | Freq: Every day | ORAL | Status: DC
  Administered 2016-04-20: 40 mg via ORAL
  Filled 2016-04-19: qty 1

## 2016-04-19 MED ORDER — ASPIRIN EC 81 MG PO TBEC
81.0000 mg | DELAYED_RELEASE_TABLET | Freq: Every day | ORAL | Status: DC
  Administered 2016-04-20: 81 mg via ORAL
  Filled 2016-04-19: qty 1

## 2016-04-19 MED ORDER — LEVOTHYROXINE SODIUM 75 MCG PO TABS
37.5000 ug | ORAL_TABLET | Freq: Every day | ORAL | Status: DC
  Administered 2016-04-20: 09:00:00 37.5 ug via ORAL
  Filled 2016-04-19: qty 1

## 2016-04-19 MED ORDER — LORATADINE 10 MG PO TABS
10.0000 mg | ORAL_TABLET | Freq: Every day | ORAL | Status: DC
  Administered 2016-04-20: 10 mg via ORAL
  Filled 2016-04-19: qty 1

## 2016-04-19 MED ORDER — SODIUM CHLORIDE 0.9 % IV SOLN
INTRAVENOUS | Status: DC
  Administered 2016-04-19 – 2016-04-20 (×3): via INTRAVENOUS

## 2016-04-19 MED ORDER — LEVOFLOXACIN IN D5W 750 MG/150ML IV SOLN
750.0000 mg | INTRAVENOUS | Status: DC
  Filled 2016-04-19: qty 150

## 2016-04-19 MED ORDER — ENOXAPARIN SODIUM 40 MG/0.4ML ~~LOC~~ SOLN
40.0000 mg | SUBCUTANEOUS | Status: DC
  Administered 2016-04-19: 40 mg via SUBCUTANEOUS
  Filled 2016-04-19: qty 0.4

## 2016-04-19 MED ORDER — ONDANSETRON HCL 4 MG/2ML IJ SOLN
4.0000 mg | Freq: Once | INTRAMUSCULAR | Status: AC
  Administered 2016-04-19: 4 mg via INTRAVENOUS

## 2016-04-19 MED ORDER — ONDANSETRON HCL 4 MG/2ML IJ SOLN
4.0000 mg | Freq: Four times a day (QID) | INTRAMUSCULAR | Status: DC | PRN
  Administered 2016-04-20: 09:00:00 4 mg via INTRAVENOUS
  Filled 2016-04-19: qty 2

## 2016-04-19 MED ORDER — DEXTROSE 5 % IV SOLN
500.0000 mg | Freq: Once | INTRAVENOUS | Status: AC
  Administered 2016-04-19: 500 mg via INTRAVENOUS
  Filled 2016-04-19 (×2): qty 500

## 2016-04-19 MED ORDER — MELATONIN 5 MG PO TABS
5.0000 mg | ORAL_TABLET | Freq: Every day | ORAL | Status: DC
  Filled 2016-04-19 (×2): qty 1

## 2016-04-19 MED ORDER — FLUTICASONE PROPIONATE 50 MCG/ACT NA SUSP
2.0000 | Freq: Every day | NASAL | Status: DC
  Administered 2016-04-19 – 2016-04-20 (×2): 2 via NASAL
  Filled 2016-04-19: qty 16

## 2016-04-19 MED ORDER — IOPAMIDOL (ISOVUE-300) INJECTION 61%
100.0000 mL | Freq: Once | INTRAVENOUS | Status: AC | PRN
  Administered 2016-04-19: 100 mL via INTRAVENOUS

## 2016-04-19 MED ORDER — LEVOFLOXACIN IN D5W 750 MG/150ML IV SOLN: 750.0000 mg | INTRAVENOUS | Status: DC

## 2016-04-19 MED ORDER — IOPAMIDOL (ISOVUE-300) INJECTION 61%
15.0000 mL | INTRAVENOUS | Status: AC
  Administered 2016-04-19: 30 mL via ORAL
  Administered 2016-04-19: 15 mL via ORAL

## 2016-04-19 MED ORDER — ONDANSETRON HCL 4 MG/2ML IJ SOLN
INTRAMUSCULAR | Status: AC
  Administered 2016-04-19: 4 mg via INTRAVENOUS
  Filled 2016-04-19: qty 2

## 2016-04-19 MED ORDER — IPRATROPIUM-ALBUTEROL 0.5-2.5 (3) MG/3ML IN SOLN
3.0000 mL | Freq: Four times a day (QID) | RESPIRATORY_TRACT | Status: DC
  Administered 2016-04-19 – 2016-04-20 (×4): 3 mL via RESPIRATORY_TRACT
  Filled 2016-04-19 (×4): qty 3

## 2016-04-19 MED ORDER — GUAIFENESIN 100 MG/5ML PO SOLN: 5.0000 mL | ORAL | Status: DC | PRN

## 2016-04-19 MED ORDER — MORPHINE SULFATE (PF) 2 MG/ML IV SOLN
2.0000 mg | Freq: Once | INTRAVENOUS | Status: AC
  Administered 2016-04-19: 2 mg via INTRAVENOUS
  Filled 2016-04-19: qty 1

## 2016-04-19 NOTE — ED Triage Notes (Addendum)
Sent in from PCP with abd pain with nausea   Hx of small bowel obstructions in past  Was given phenergan in office PTA  She also has had less output in ostomy bag this am

## 2016-04-19 NOTE — ED Provider Notes (Signed)
[facility] Emergency Department Provider Note ____________________________________________   First MD Initiated Contact with Patient 04/19/16 1148     (approximate)  I have reviewed the triage vital signs and the nursing notes.   HISTORY  Chief Complaint Abdominal Pain  EM caveat: Limitations to the patient's cognitive impairment and Down syndrome  HPI Lauren Nixon Grade is a 50 y.o. female who reported pain in the upper abdomen and back area to her sister today. Therefore it is unusual for her to complain of pain. She has not vomited and her ostomy output has been slightly lower than usual, but since has continued to have good stool output this afternoon. They saw their doctor who was concerned about possible abdominal etiology and recommended she come for a CT to exclude a bowel obstruction given her previous history  She has been eating normally. No vomiting. She did appear nauseated and had some dry heaving earlier today but this has resolved.  No fevers or chills. She has not been acting unusually and her mental status has been normal  Past Medical History:  Diagnosis Date  . Down's syndrome   . GERD (gastroesophageal reflux disease)   . Hirschsprung disease   . Thyroid disease   . Ventral hernia     Patient Active Problem List   Diagnosis Date Noted  . Pneumonia 04/19/2016  . Aftercare following surgery 03/20/2015  . 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 Disease of thyroid gland 03/19/2015  . Incarcerated ventral hernia 03/15/2015  . Partial bowel obstruction   . Ventral hernia with obstruction and without gangrene   . Allergic rhinitis, seasonal 12/23/2014  . Gastro-esophageal reflux disease without esophagitis 12/23/2014  . 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 COLOSTOMY    . VENTRAL HERNIA REPAIR N/A 03/15/2015   Procedure: VENTRAL HERNIA REPAIR FOR INCARCERATED VENTRAL HERNIA ;   Surgeon: Ida Rogue, MD;  Location: [facility] ORS;  Service: General;  Laterality: N/A;    Prior to Admission medications   Medication Sig Start Date End Date Taking? Authorizing Provider  aspirin EC 81 MG tablet Take 81 mg by mouth daily.   Yes Historical Provider, MD  cetirizine (ZYRTEC) 10 MG tablet Take 10 mg by mouth daily.   Yes Historical Provider, MD  esomeprazole (NEXIUM) 40 MG capsule Take 1 capsule by mouth 2 (two) times daily before a meal.  02/11/15  Yes Historical Provider, MD  fluticasone (FLONASE) 50 MCG/ACT nasal spray Place 2 sprays into both nostrils daily.  02/11/15  Yes Historical Provider, MD  levothyroxine (SYNTHROID, LEVOTHROID) 75 MCG tablet Take 0.5 tablets by mouth daily. 12/23/14  Yes Historical Provider, MD  Melatonin 3 MG TABS Take 1 tablet by mouth at bedtime.   Yes Historical Provider, MD  Multiple Vitamin (MULTI-VITAMINS) TABS Take by mouth.   Yes Historical Provider, MD    Allergies Penicillins  Family History  Problem Relation Age of Onset  . Hypertension Father     Social History Social History  Substance Use Topics  . Smoking status: Never Smoker  . Smokeless tobacco: Never Used  . Alcohol use No    Review of Systems - per patient's sister primarily Constitutional: No fever/chills Eyes: No visual changes. ENT: No sore throat. Cardiovascular: Denies chest pain. Respiratory: Denies shortness of breath. Gastrointestinal:  No nausea, no vomiting.  Genitourinary: Negative for dysuria. Musculoskeletal: Negative for back pain. Skin: Negative for rash. Neurological: Negative for headaches, focal weakness or numbness.  10-point ROS otherwise negative.  ____________________________________________   PHYSICAL EXAM:  VITAL SIGNS: ED Triage Vitals  Enc Vitals Group     BP 04/19/16 1100 (!) 78/64     Pulse Rate 04/19/16 1100 84     Resp 04/19/16 1100 18     Temp 04/19/16 1100 98.2 F (36.8 C)     Temp Source 04/19/16 1100 Oral      SpO2 04/19/16 1100 96 %     Weight 04/19/16 1038 120 lb (54.4 kg)     Height 04/19/16 1038 4\' 7"  (1.397 m)     Head Circumference --      Peak Flow --      Pain Score 04/19/16 1038 7     Pain Loc --      Pain Edu? --      Excl. in GC? --     Constitutional: Alert and oriented. Well appearing and in no acute distress.Appears very well cared for. Her sister is with her who is very attentive and pleasant. Eyes: Conjunctivae are normal. PERRL. EOMI. Head: Atraumatic. Nose: No congestion/rhinnorhea. Mouth/Throat: Mucous membranes are moist.  Oropharynx non-erythematous. Neck: No stridor.   Cardiovascular: Normal rate, regular rhythm. Grossly normal heart sounds.  Good peripheral circulation. Respiratory: Normal respiratory effort.  Somewhat diminished in the bases bilateral but otherwise clear. Gastrointestinal: Soft and nontender. No distention. Ostomy draining normal brown stool.  Musculoskeletal: No lower extremity tenderness nor edema.   Neurologic:  Very pleasant. Moves extremities without difficulty. Smiles. Skin:  Skin is warm, dry and intact. No rash noted. Psychiatric: Mood and affect are calm and very pleasant. Speech and behavior are normal.  ____________________________________________   LABS (all labs ordered are listed, but only abnormal results are displayed)  Labs Reviewed  COMPREHENSIVE METABOLIC PANEL - Abnormal; Notable for the following:       Result Value   Chloride 100 (*)    Glucose, Bld 150 (*)    Total Protein 8.4 (*)    Total Bilirubin 0.2 (*)    All other components within normal limits  CBC - Abnormal; Notable for the following:    WBC 20.2 (*)    All other components within normal limits  URINALYSIS COMPLETEWITH MICROSCOPIC ([facility] ONLY) - Abnormal; Notable for the following:    Color, Urine YELLOW (*)    APPearance CLOUDY (*)    Hgb urine dipstick 1+ (*)    Leukocytes, UA TRACE (*)    Squamous Epithelial / LPF 0-5 (*)    All other components  within normal limits  CULTURE, BLOOD (ROUTINE X 2)  CULTURE, BLOOD (ROUTINE X 2)  URINE CULTURE  LIPASE, BLOOD  TROPONIN I   ____________________________________________  EKG  Reviewed and interpreted by me at 12:28 PM I radiated QRS 70 QTc 460 There is a slight abnormality noted that could be reversed slight PR interval depression versus less likely a slight repolarization abnormality felt unlikely to resemble that of acute ST elevation MI noted in multiple leads  And as compared to her EKG from 03/15/2015, ST segments looks similar in appearance.  Do not believe there is evidence of acute ischemic disease noted.  ____________________________________________  RADIOLOGY  Ct Abdomen Pelvis W Contrast  Result Date: 04/19/2016 CLINICAL DATA:  Abdominal pain and elevated white count. History of dementia. Colostomy for bowel obstruction. EXAM: CT ABDOMEN AND PELVIS WITH CONTRAST TECHNIQUE: Multidetector CT imaging of the abdomen and pelvis was performed using the standard protocol following bolus administration of intravenous contrast. CONTRAST:  100mL ISOVUE-300 IOPAMIDOL (ISOVUE-300) INJECTION 61% COMPARISON:  03/15/2015 FINDINGS: Lower chest: There is bilateral lower lobe pneumonia and volume loss. No pleural fluid. Small pericardial effusion. Hepatobiliary: Previous cholecystectomy. No liver parenchymal lesion. Pancreas: Normal Spleen: Normal except for benign calcification. Adrenals/Urinary Tract: Adrenal glands are normal. Kidneys are normal except for a 1.5 cm simple cyst at the lower pole on the left. Stomach/Bowel: Left lower quadrant ostomy with associated hernia containing mesenteric fat. No obstruction at this site. Right anterior abdominal wall hernia with the defect measuring 6 cm in diameter, containing mesentery and nonobstructed small intestine. Vascular/Lymphatic: The aorta and IVC are normal. No retroperitoneal adenopathy or mass. Reproductive: Uterus and adnexal regions are  normal. Mucous fistula appears unremarkable. Other: No free fluid or air. Musculoskeletal: Ordinary degenerative changes affect the spine. IMPRESSION: Bilateral lower lobe pneumonia. Left lower quadrant ostomy with a small amount of associated herniated fat. No obstruction or complication otherwise. Right anterior abdominal wall hernias with the defect measuring 6 cm in diameter. Herniated small intestine and mesentery. No obstruction. Electronically Signed   By: Paulina FusiMark  Shogry M.D.   On: 04/19/2016 14:34   Dg Chest Portable 1 View  Result Date: 04/19/2016 CLINICAL DATA:  Initial evaluation for air possible pneumonia. EXAM: PORTABLE CHEST 1 VIEW COMPARISON:  Prior radiograph from 03/14/2015. FINDINGS: Cardiac and mediastinal silhouettes are stable in size and contour, and remain within normal limits. Lungs are hypoinflated. Secondary diffuse bronchovascular crowding with vascular congestion. Patchy bibasilar opacities at least in part reflect atelectasis and/ or bronchovascular crowding, however, superimposed infiltrates may be present as well. No pleural effusion. Vascular congestion without overt pulmonary edema. No pneumothorax. No acute osseous abnormality. IMPRESSION: Shallow lung inflation with patchy bibasilar opacities. While these findings at least in part reflect atelectasis/ bronchovascular crowding, possible infectious infiltrates may be present as well. Electronically Signed   By: Rise MuBenjamin  McClintock M.D.   On: 04/19/2016 15:24    ____________________________________________   PROCEDURES  Procedure(s) performed: None  Procedures  Critical Care performed: No  ____________________________________________   INITIAL IMPRESSION / ASSESSMENT AND PLAN / ED COURSE  Pertinent labs & imaging results that were available during my care of the patient were reviewed by me and considered in my medical decision making (see chart for details).  Patient presents for evaluation after reporting  pain across the lower back and abdomen. She does have cognitive impairment which makes it slightly hard to tell, but she reports that the pain and symptoms are better. Her sister who is pleasant and with her reports that she is concerned she could have a bowel obstruction as she is had similar discomfort with those in the past however ostomy output remains normal and there has been no vomiting. EKG does not demonstrate acute ST abnormality, and her first troponin is normal and her pain has resolved.  ----------------------------------------- 3:00 PM on 04/19/2016 -----------------------------------------  CT demonstrates possible bilateral lower lobe pneumonia. She is not having any further abdominal symptoms or pain. Her abdominal exam is reassuring at this time, but she certainly has a complicated abdominal history. Based upon imaging along with leukocytosis I am concerned that pneumonia may be etiology to cause her symptoms and back pain. Plan to admit the patient for further workup, close monitoring, treatment for community acquired pneumonia, as well as ongoing observation for abdominal symptomatology as well. Family very agreeable with this plan and understand that my diagnosis of bilateral lower lobe pneumonia is quite provisional in nature at this time.  Patient presently reports no  pain or symptoms.  Clinical Course     ____________________________________________   FINAL CLINICAL IMPRESSION(S) / ED DIAGNOSES  Final diagnoses:  Pneumonia of both lower lobes due to infectious organism  Community acquired pneumonia, unspecified laterality      NEW MEDICATIONS STARTED DURING THIS VISIT:  New Prescriptions   No medications on file     Note:  This document was prepared using Dragon voice recognition software and may include unintentional dictation errors.     Sharyn Creamer, MD 04/19/16 [phone]

## 2016-04-19 NOTE — Consult Note (Signed)
Pharmacy Antibiotic Note  Lauren Nixon is a 50 y.o. female admitted on 04/19/2016 with pneumonia.  Pharmacy has been consulted for levofloxacin dosing (renal adjustmet)  Plan: The dose of 750 mg will be adjusted to 48 based on renal function. for 5 days Will start tomorrow since needs to be separated from azithromycin by 24 hr  Height: 4\' 7"  (139.7 cm) Weight: 120 lb (54.4 kg) IBW/kg (Calculated) : 34  Temp (24hrs), Avg:98.2 F (36.8 C), Min:98.2 F (36.8 C), Max:98.2 F (36.8 C)   Recent Labs Lab 04/19/16 1111  WBC 20.2*  CREATININE 0.94    Estimated Creatinine Clearance: 47.2 mL/min (by C-G formula based on SCr of 0.94 mg/dL).    Allergies  Allergen Reactions  . Penicillins Other (See Comments)    Childhood allergy- sister does not know details    Antimicrobials this admission: ceftriaxone 10/31 >> 10/31 azithromycin 10/31 >> 10/31 Levofloxacin 11/1>>  Dose adjustments this admission:   Microbiology results: 10/31 BCx:  10/31 UCx:   10/31 Sputum: ordered  10/31 Strep pneumo antigen: ordered   Thank you for allowing pharmacy to be a part of this patient's care.  Olene FlossMelissa D Afra Tricarico 04/19/2016 4:26 PM

## 2016-04-19 NOTE — ED Notes (Signed)
Patient presents with caregivers.  Caregivers state that patient did not empty colostomy bag last night, and typically patient wakes and empties bag.  This morning patient started c/o low back pain, than neck and chest.  Seen at Dr. Flavia ShipperBaboff's office.  EKG done and phenergan 50 mg IM at office.

## 2016-04-19 NOTE — H&P (Addendum)
[facility] - [place] [facility]   PATIENT NAME: Lauren Nixon    MR#:  [id number]  DATE OF BIRTH:  1966-01-20  DATE OF ADMISSION:  04/19/2016  PRIMARY CARE PHYSICIAN: BABAOFF, Lavada MesiMARC E, MD   REQUESTING/REFERRING PHYSICIAN: Sharyn CreamerMark Quale, MD  CHIEF COMPLAINT:   Chief Complaint  Patient presents with  . Abdominal Pain   Abdominal pain HISTORY OF PRESENT ILLNESS:  Lauren DimitriMelanie Yebra  is a 50 y.o. female with a known history of Down syndrome, thyroid disease and GERD. Abdominal pain and back pain today per her sister. The patient is demented, unable to provide any information. The patient to get a CAT scan of abdomen, which didn't show any obstruction but bilateral base pneumonia.  PAST MEDICAL HISTORY:   Past Medical History:  Diagnosis Date  . Down's syndrome   . GERD (gastroesophageal reflux disease)   . Hirschsprung disease   . Thyroid disease   . Ventral hernia     PAST SURGICAL HISTORY:   Past Surgical History:  Procedure Laterality Date  . COLOSTOMY    . VENTRAL HERNIA REPAIR N/A 03/15/2015   Procedure: VENTRAL HERNIA REPAIR FOR INCARCERATED VENTRAL HERNIA ;  Surgeon: Ida Roguehristopher Lundquist, MD;  Location: [facility] ORS;  Service: General;  Laterality: N/A;    SOCIAL HISTORY:   Social History  Substance Use Topics  . Smoking status: Never Smoker  . Smokeless tobacco: Never Used  . Alcohol use No    FAMILY HISTORY:   Family History  Problem Relation Age of Onset  . Hypertension Father     DRUG ALLERGIES:   Allergies  Allergen Reactions  . Penicillins Other (See Comments)    Childhood allergy- sister does not know details    REVIEW OF SYSTEMS:   Review of Systems  Unable to perform ROS: Mental acuity    MEDICATIONS AT HOME:   Prior to Admission medications   Medication Sig Start Date End Date Taking? Authorizing Provider  aspirin EC 81 MG tablet Take 81 mg by mouth daily.   Yes Historical Provider, MD  cetirizine (ZYRTEC) 10 MG  tablet Take 10 mg by mouth daily.   Yes Historical Provider, MD  esomeprazole (NEXIUM) 40 MG capsule Take 1 capsule by mouth 2 (two) times daily before a meal.  02/11/15  Yes Historical Provider, MD  fluticasone (FLONASE) 50 MCG/ACT nasal spray Place 2 sprays into both nostrils daily.  02/11/15  Yes Historical Provider, MD  levothyroxine (SYNTHROID, LEVOTHROID) 75 MCG tablet Take 0.5 tablets by mouth daily. 12/23/14  Yes Historical Provider, MD  Melatonin 3 MG TABS Take 1 tablet by mouth at bedtime.   Yes Historical Provider, MD  Multiple Vitamin (MULTI-VITAMINS) TABS Take by mouth.   Yes Historical Provider, MD      VITAL SIGNS:  Blood pressure 104/71, pulse 79, temperature 98.2 F (36.8 C), temperature source Oral, resp. rate 15, height 4\' 7"  (1.397 m), weight 120 lb (54.4 kg), SpO2 100 %.  PHYSICAL EXAMINATION:  Physical Exam  GENERAL:  50 y.o.-year-old patient lying in the bed with no acute distress.  EYES: Pupils equal, round, reactive to light and accommodation. No scleral icterus. Extraocular muscles intact.  HEENT: Head atraumatic, normocephalic. Oropharynx and nasopharynx clear. Moist oral mucosa NECK:  Supple, no jugular venous distention. No thyroid enlargement, no tenderness.  LUNGS: Normal breath sounds bilaterally, no wheezing, rales,rhonchi or crepitation. No use of accessory muscles of respiration.  CARDIOVASCULAR: S1, S2 normal. No murmurs, rubs, or gallops.  ABDOMEN: Soft, nontender,  nondistended. Bowel sounds present. No organomegaly or mass.  EXTREMITIES: No pedal edema, cyanosis, or clubbing.  NEUROLOGIC: Cranial nerves II through XII are intact. Muscle strength 5/5 in all extremities. Sensation intact. Gait not checked.  PSYCHIATRIC: The patient is Demented, AAO 1. SKIN: No obvious rash, lesion, or ulcer.   LABORATORY PANEL:   CBC  Recent Labs Lab 04/19/16 1111  WBC 20.2*  HGB 14.7  HCT 42.7  PLT 340    ------------------------------------------------------------------------------------------------------------------  Chemistries   Recent Labs Lab 04/19/16 1111  NA 136  K 3.5  CL 100*  CO2 23  GLUCOSE 150*  BUN 13  CREATININE 0.94  CALCIUM 9.2  AST 40  ALT 20  ALKPHOS 73  BILITOT 0.2*   ------------------------------------------------------------------------------------------------------------------  Cardiac Enzymes  Recent Labs Lab 04/19/16 1111  TROPONINI <0.03   ------------------------------------------------------------------------------------------------------------------  RADIOLOGY:  Ct Abdomen Pelvis W Contrast  Result Date: 04/19/2016 CLINICAL DATA:  Abdominal pain and elevated white count. History of dementia. Colostomy for bowel obstruction. EXAM: CT ABDOMEN AND PELVIS WITH CONTRAST TECHNIQUE: Multidetector CT imaging of the abdomen and pelvis was performed using the standard protocol following bolus administration of intravenous contrast. CONTRAST:  ISOVUE-300 IOPAMIDOL (ISOVUE-300) INJECTION 61% COMPARISON:  03/15/2015 FINDINGS: Lower chest: There is bilateral lower lobe pneumonia and volume loss. No pleural fluid. Small pericardial effusion. Hepatobiliary: Previous cholecystectomy. No liver parenchymal lesion. Pancreas: Normal Spleen: Normal except for benign calcification. Adrenals/Urinary Tract: Adrenal glands are normal. Kidneys are normal except for a 1.5 cm simple cyst at the lower pole on the left. Stomach/Bowel: Left lower quadrant ostomy with associated hernia containing mesenteric fat. No obstruction at this site. Right anterior abdominal wall hernia with the defect measuring 6 cm in diameter, containing mesentery and nonobstructed small intestine. Vascular/Lymphatic: The aorta and IVC are normal. No retroperitoneal adenopathy or mass. Reproductive: Uterus and adnexal regions are normal. Mucous fistula appears unremarkable. Other: No free fluid or  air. Musculoskeletal: Ordinary degenerative changes affect the spine. IMPRESSION: Bilateral lower lobe pneumonia. Left lower quadrant ostomy with a small amount of associated herniated fat. No obstruction or complication otherwise. Right anterior abdominal wall hernias with the defect measuring 6 cm in diameter. Herniated small intestine and mesentery. No obstruction. Electronically Signed   By: Paulina Fusi M.D.   On: 04/19/2016 14:34   Dg Chest Portable 1 View  Result Date: 04/19/2016 CLINICAL DATA:  Initial evaluation for air possible pneumonia. EXAM: PORTABLE CHEST 1 VIEW COMPARISON:  Prior radiograph from 03/14/2015. FINDINGS: Cardiac and mediastinal silhouettes are stable in size and contour, and remain within normal limits. Lungs are hypoinflated. Secondary diffuse bronchovascular crowding with vascular congestion. Patchy bibasilar opacities at least in part reflect atelectasis and/ or bronchovascular crowding, however, superimposed infiltrates may be present as well. No pleural effusion. Vascular congestion without overt pulmonary edema. No pneumothorax. No acute osseous abnormality. IMPRESSION: Shallow lung inflation with patchy bibasilar opacities. While these findings at least in part reflect atelectasis/ bronchovascular crowding, possible infectious infiltrates may be present as well. Electronically Signed   By: Rise Mu M.D.   On: 04/19/2016 15:24      IMPRESSION AND PLAN:   Bilateral PNA (CAP) with leukocytosis. The patient will be admitted to medical floor. She is treated with Rocephin and Zithromax in the ED. I will change to Levaquin IV, give nebulizer and robitussin prn,  follow-up CBC, sputum culture and blood culture.  Hypothyroidism. Continue Synthroid.  Down syndrome.  All the records are reviewed and case discussed with ED provider. Management  plans discussed with the patient's sister (POA) and they are in agreement.  CODE STATUS: Full code  TOTAL TIME  TAKING CARE OF THIS PATIENT: 53 minutes.    Shaune Pollackhen, Dorsey Authement M.D on 04/19/2016 at 3:44 PM  Between 7am to 6pm - Pager - [phone]  After 6pm go to [email or web address] - Social research officer, governmentpassword [email or web address] [facility]  [facility] [place] [facility]  Office  [phone]  CC: Primary care physician; BABAOFF, Lavada MesiMARC E, MD   Note: This dictation was prepared with Dragon dictation along with smaller phrase technology. Any transcriptional errors that result from this process are unintentional.

## 2016-04-19 NOTE — ED Notes (Signed)
To CT Scan via stretcher. AAOx3.  Skin warm and dry. NAD. Continue to monitor. 

## 2016-04-20 LAB — CBC
HCT: 37.3 % (ref 35.0–47.0)
Hemoglobin: 12.6 g/dL (ref 12.0–16.0)
MCH: 31.4 pg (ref 26.0–34.0)
MCHC: 33.7 g/dL (ref 32.0–36.0)
MCV: 93 fL (ref 80.0–100.0)
PLATELETS: 278 10*3/uL (ref 150–440)
RBC: 4.01 MIL/uL (ref 3.80–5.20)
RDW: 13.5 % (ref 11.5–14.5)
WBC: 18 10*3/uL — ABNORMAL HIGH (ref 3.6–11.0)

## 2016-04-20 MED ORDER — ACETAMINOPHEN 325 MG PO TABS: 650.0000 mg | ORAL_TABLET | Freq: Four times a day (QID) | ORAL | 0 refills | Status: DC | PRN

## 2016-04-20 MED ORDER — LEVOFLOXACIN 750 MG PO TABS: 750.0000 mg | ORAL_TABLET | Freq: Every day | ORAL | 0 refills | Status: DC

## 2016-04-20 NOTE — Progress Notes (Signed)
PT Cancellation Note  Patient Details Name: Lauren Nixon MRN: [id number] DOB: [birth year]   Cancelled Treatment:    Reason Eval/Treat Not Completed: Other (comment). Consult received and chart reviewed. Pt currently sleeping and family in room request therapist return at another time. Will re-attempt if able.   Davene Jobin 04/20/2016, 11:25 AM  Elizabeth PalauStephanie Afsheen Antony, PT, DPT [phone]

## 2016-04-20 NOTE — Evaluation (Signed)
Physical Therapy Evaluation Patient Details Name: Lauren SealsMelanie B Nixon MRN: [id number] DOB: 1965/09/19 Today's Date: 04/20/2016   History of Present Illness  Pt admitted for pneumonia. Pt with complaints of abdominal pain and low back pain. Pt with history of Down syndrome and GERD. Pt also has colostomy bag.   Clinical Impression  Pt is a pleasant 50 year old female who was admitted for pneumonia. Pt performs bed mobility with cga, transfers with mod I, and ambulation with cga and IV pole. Pt demonstrates deficits with endurance. Pt very close to baseline level. Recommend continued use of RW to prevent falls. Assisted pt in positioning in bed to alleviate back pain. Would benefit from skilled PT to address above deficits and promote optimal return to PLOF. Recommend transition to HHPT upon discharge from acute hospitalization.       Follow Up Recommendations Home health PT    Equipment Recommendations  Rolling walker with 5" wheels    Recommendations for Other Services       Precautions / Restrictions Precautions Precautions: Fall Restrictions Weight Bearing Restrictions: No      Mobility  Bed Mobility Overal bed mobility: Needs Assistance Bed Mobility: Supine to Sit     Supine to sit: Min guard     General bed mobility comments: slight assist required for sequencing. Pt able to initiate movement safely. Once seated at EOB, pt able to sit with supervision  Transfers Overall transfer level: Modified independent Equipment used: 1 person hand held assist             General transfer comment: safe technique standing with no LOB noted. HHA assist required  Ambulation/Gait Ambulation/Gait assistance: Min guard Ambulation Distance (Feet): 100 Feet Assistive device:  (IV pole) Gait Pattern/deviations: Step-through pattern     General Gait Details: short reciprocal gait pattern performed with good speed. Pt complains of back pain, limiting further distance at this time.  No LOB noted with B UE support on IV pole  Stairs            Wheelchair Mobility    Modified Rankin (Stroke Patients Only)       Balance Overall balance assessment: History of Falls;Needs assistance Sitting-balance support: Feet supported Sitting balance-Leahy Scale: Good     Standing balance support: Bilateral upper extremity supported Standing balance-Leahy Scale: Fair                               Pertinent Vitals/Pain Pain Assessment: Faces Faces Pain Scale: Hurts even more Pain Location: low back Pain Descriptors / Indicators: Aching;Dull Pain Intervention(s): Limited activity within patient's tolerance;Repositioned    Home Living Family/patient expects to be discharged to:: Private residence Living Arrangements: Other relatives (sister and brother in Social workerlaw) Available Help at Discharge: Family Type of Home: House Home Access: Stairs to enter Entrance Stairs-Rails: Can reach both Entrance Stairs-Number of Steps: 1 step to go to porch and then 1 step to enter house Home Layout: One level Home Equipment: Environmental consultantWalker - 4 wheels      Prior Function Level of Independence: Independent         Comments: typically doesn't use AD, however has been having increased frequency of falls     Hand Dominance        Extremity/Trunk Assessment   Upper Extremity Assessment: Overall WFL for tasks assessed           Lower Extremity Assessment: Generalized weakness (B LE grossly 4/5)  Communication   Communication: No difficulties  Cognition Arousal/Alertness: Awake/alert Behavior During Therapy: WFL for tasks assessed/performed Overall Cognitive Status: History of cognitive impairments - at baseline                      General Comments      Exercises     Assessment/Plan    PT Assessment Patient needs continued PT services  PT Problem List Decreased strength;Decreased activity tolerance;Decreased balance;Decreased  mobility          PT Treatment Interventions Gait training;DME instruction;Therapeutic exercise    PT Goals (Current goals can be found in the Care Plan section)  Acute Rehab PT Goals Patient Stated Goal: to get stronger PT Goal Formulation: With patient Time For Goal Achievement: 05/04/16 Potential to Achieve Goals: Good    Frequency Min 2X/week   Barriers to discharge        Co-evaluation               End of Session Equipment Utilized During Treatment: Gait belt Activity Tolerance: Patient tolerated treatment well Patient left: in bed;with bed alarm set;with family/visitor present Nurse Communication: Mobility status         Time: 1409-1430 PT Time Calculation (min) (ACUTE ONLY): 21 min   Charges:   PT Evaluation $PT Eval Low Complexity: 1 Procedure     PT G Codes:        Lauren Nixon 04/20/2016, 3:38 PM  Lauren PalauStephanie Virgene Nixon, PT, DPT [phone]

## 2016-04-20 NOTE — Progress Notes (Signed)
[facility] [place] [facility]   PATIENT NAME: Lauren Nixon    MR#:  [id number]  DATE OF BIRTH:  1965/07/29  SUBJECTIVE:   Doing better this am  REVIEW OF SYSTEMS:    Review of Systems  Constitutional: Negative.  Negative for chills, fever and malaise/fatigue.  HENT: Negative.  Negative for ear discharge, ear pain, hearing loss, nosebleeds and sore throat.   Eyes: Negative.  Negative for blurred vision and pain.  Respiratory: Negative.  Negative for cough, hemoptysis, shortness of breath and wheezing.   Cardiovascular: Negative.  Negative for chest pain, palpitations and leg swelling.  Gastrointestinal: Negative.  Negative for abdominal pain, blood in stool, diarrhea, nausea and vomiting.  Genitourinary: Negative.  Negative for dysuria.  Musculoskeletal: Negative.  Negative for back pain.  Skin: Negative.   Neurological: Negative for dizziness, tremors, speech change, focal weakness, seizures and headaches.  Endo/Heme/Allergies: Negative.  Does not bruise/bleed easily.  Psychiatric/Behavioral: Negative.  Negative for depression, hallucinations and suicidal ideas.    Tolerating Diet: yes      DRUG ALLERGIES:   Allergies  Allergen Reactions  . Penicillins Other (See Comments)    Childhood allergy- sister does not know details    VITALS:  Blood pressure 107/60, pulse (!) 107, temperature 98.2 F (36.8 C), temperature source Oral, resp. rate 20, height 4\' 7"  (1.397 m), weight 52.8 kg (116 lb 6.4 oz), SpO2 90 %.  PHYSICAL EXAMINATION:   Physical Exam    LABORATORY PANEL:   CBC  Recent Labs Lab 04/20/16 0327  WBC 18.0*  HGB 12.6  HCT 37.3  PLT 278   ------------------------------------------------------------------------------------------------------------------  Chemistries   Recent Labs Lab 04/19/16 1111  NA 136  K 3.5  CL 100*  CO2 23  GLUCOSE 150*  BUN 13  CREATININE 0.94  CALCIUM 9.2  AST 40  ALT 20  ALKPHOS 73   BILITOT 0.2*   ------------------------------------------------------------------------------------------------------------------  Cardiac Enzymes  Recent Labs Lab 04/19/16 1111  TROPONINI <0.03   ------------------------------------------------------------------------------------------------------------------  RADIOLOGY:  Ct Abdomen Pelvis W Contrast  Result Date: 04/19/2016 CLINICAL DATA:  Abdominal pain and elevated white count. History of dementia. Colostomy for bowel obstruction. EXAM: CT ABDOMEN AND PELVIS WITH CONTRAST TECHNIQUE: Multidetector CT imaging of the abdomen and pelvis was performed using the standard protocol following bolus administration of intravenous contrast. CONTRAST:  100mL ISOVUE-300 IOPAMIDOL (ISOVUE-300) INJECTION 61% COMPARISON:  03/15/2015 FINDINGS: Lower chest: There is bilateral lower lobe pneumonia and volume loss. No pleural fluid. Small pericardial effusion. Hepatobiliary: Previous cholecystectomy. No liver parenchymal lesion. Pancreas: Normal Spleen: Normal except for benign calcification. Adrenals/Urinary Tract: Adrenal glands are normal. Kidneys are normal except for a 1.5 cm simple cyst at the lower pole on the left. Stomach/Bowel: Left lower quadrant ostomy with associated hernia containing mesenteric fat. No obstruction at this site. Right anterior abdominal wall hernia with the defect measuring 6 cm in diameter, containing mesentery and nonobstructed small intestine. Vascular/Lymphatic: The aorta and IVC are normal. No retroperitoneal adenopathy or mass. Reproductive: Uterus and adnexal regions are normal. Mucous fistula appears unremarkable. Other: No free fluid or air. Musculoskeletal: Ordinary degenerative changes affect the spine. IMPRESSION: Bilateral lower lobe pneumonia. Left lower quadrant ostomy with a small amount of associated herniated fat. No obstruction or complication otherwise. Right anterior abdominal wall hernias with the defect  measuring 6 cm in diameter. Herniated small intestine and mesentery. No obstruction. Electronically Signed   By: Paulina FusiMark  Shogry M.D.   On: 04/19/2016 14:34   Dg Chest  Portable 1 View  Result Date: 04/19/2016 CLINICAL DATA:  Initial evaluation for air possible pneumonia. EXAM: PORTABLE CHEST 1 VIEW COMPARISON:  Prior radiograph from 03/14/2015. FINDINGS: Cardiac and mediastinal silhouettes are stable in size and contour, and remain within normal limits. Lungs are hypoinflated. Secondary diffuse bronchovascular crowding with vascular congestion. Patchy bibasilar opacities at least in part reflect atelectasis and/ or bronchovascular crowding, however, superimposed infiltrates may be present as well. No pleural effusion. Vascular congestion without overt pulmonary edema. No pneumothorax. No acute osseous abnormality. IMPRESSION: Shallow lung inflation with patchy bibasilar opacities. While these findings at least in part reflect atelectasis/ bronchovascular crowding, possible infectious infiltrates may be present as well. Electronically Signed   By: Rise MuBenjamin  McClintock M.D.   On: 04/19/2016 15:24     ASSESSMENT AND PLAN:   50 y/o female with Downs syndrome here with bilateral PNA.    1. Bilateral PNA with elevated WBC: Her symptoms have improved. She is not hypoxic, WBC trending down and clincally improvement. She can be transitioned to PO Antibiotics.  2. Hypothryoid: Continue Synthroid  3. Down Syndrome: PT consult for dispo    Management plans discussed with the patient's family and they are in agreement.  CODE STATUS: full  TOTAL TIME TAKING CARE OF THIS PATIENT: 30 minutes.     POSSIBLE D/C today, DEPENDING ON CLINICAL CONDITION.   Teisha Trowbridge M.D on 04/20/2016 at 11:44 AM  Between 7am to 6pm - Pager - [phone] After 6pm go to [email or web address] - password [email or web address]  [facility] [place] [facility]  Office  [phone]  CC: Primary care physician; BABAOFF, Lavada MesiMARC E, MD  Note:  This dictation was prepared with Dragon dictation along with smaller phrase technology. Any transcriptional errors that result from this process are unintentional.

## 2016-04-20 NOTE — Discharge Summary (Signed)
[facility] [place] [facility]   PATIENT NAME: Lauren Nixon    MR#:  [id number]  DATE OF BIRTH:  [birth year]  DATE OF ADMISSION:  04/19/2016 ADMITTING PHYSICIAN: Shaune PollackQing Chen, MD  DATE OF DISCHARGE: 04/20/2016  PRIMARY CARE PHYSICIAN: BABAOFF, MARC E, MD    ADMISSION DIAGNOSIS:  Pneumonia of both lower lobes due to infectious organism [J18.9] Community acquired pneumonia, unspecified laterality [J18.9]  DISCHARGE DIAGNOSIS:  Active Problems:   Pneumonia   SECONDARY DIAGNOSIS:   Past Medical History:  Diagnosis Date  . Down's syndrome   . GERD (gastroesophageal reflux disease)   . Hirschsprung disease   . Thyroid disease   . Ventral hernia     HOSPITAL COURSE:  50 y/o female with Downs syndrome here with bilateral PNA.  1. Bilateral PNA with elevated WBC: Her symptoms have improved. She is not hypoxic, WBC trending down and clincally improvement. She can be transitioned to PO Antibiotics.  2. Hypothryoid: Continue Synthroid  3. Down Syndrome: PT consult for dispo   DISCHARGE CONDITIONS AND DIET:   Stable regular diet  CONSULTS OBTAINED:    DRUG ALLERGIES:   Allergies  Allergen Reactions  . Penicillins Other (See Comments)    Childhood allergy- sister does not know details    DISCHARGE MEDICATIONS:   Current Discharge Medication List    START taking these medications   Details  acetaminophen (TYLENOL) 325 MG tablet Take 2 tablets (650 mg total) by mouth every 6 (six) hours as needed for mild pain or fever. Qty: 60 tablet, Refills: 0    levofloxacin (LEVAQUIN) 750 MG tablet Take 1 tablet (750 mg total) by mouth daily. Qty: 5 tablet, Refills: 0      CONTINUE these medications which have NOT CHANGED   Details  aspirin EC 81 MG tablet Take 81 mg by mouth daily.    cetirizine (ZYRTEC) 10 MG tablet Take 10 mg by mouth daily.    esomeprazole (NEXIUM) 40 MG capsule Take 1 capsule by mouth 2 (two) times daily before a meal.      fluticasone (FLONASE) 50 MCG/ACT nasal spray Place 2 sprays into both nostrils daily.     levothyroxine (SYNTHROID, LEVOTHROID) 75 MCG tablet Take 0.5 tablets by mouth daily.    Melatonin 3 MG TABS Take 1 tablet by mouth at bedtime.    Multiple Vitamin (MULTI-VITAMINS) TABS Take by mouth.              Today   CHIEF COMPLAINT:  Patient doing well   VITAL SIGNS:  Blood pressure 107/60, pulse (!) 107, temperature 98.2 F (36.8 C), temperature source Oral, resp. rate 20, height 4\' 7"  (1.397 m), weight 52.8 kg (116 lb 6.4 oz), SpO2 90 %.   REVIEW OF SYSTEMS:  Review of Systems  Constitutional: Negative.  Negative for chills, fever and malaise/fatigue.  HENT: Negative.  Negative for ear discharge, ear pain, hearing loss, nosebleeds and sore throat.   Eyes: Negative.  Negative for blurred vision and pain.  Respiratory: Negative.  Negative for cough, hemoptysis, shortness of breath and wheezing.   Cardiovascular: Negative.  Negative for chest pain, palpitations and leg swelling.  Gastrointestinal: Negative.  Negative for abdominal pain, blood in stool, diarrhea, nausea and vomiting.  Genitourinary: Negative.  Negative for dysuria.  Musculoskeletal: Negative.  Negative for back pain.  Skin: Negative.   Neurological: Negative for dizziness, tremors, speech change, focal weakness, seizures and headaches.  Endo/Heme/Allergies: Negative.  Does not bruise/bleed easily.  Psychiatric/Behavioral: Negative.  Negative for depression, hallucinations and suicidal ideas.     PHYSICAL EXAMINATION:  GENERAL:  50 y.o.-year-old patient lying in the bed with no acute distress.  NECK:  Supple, no jugular venous distention. No thyroid enlargement, no tenderness.  LUNGS: Normal breath sounds bilaterally, no wheezing, rales,rhonchi  No use of accessory muscles of respiration.  CARDIOVASCULAR: S1, S2 normal. No murmurs, rubs, or gallops.  ABDOMEN: Soft, non-tender, non-distended. Bowel sounds  present. No organomegaly or mass.  EXTREMITIES: No pedal edema, cyanosis, or clubbing.  PSYCHIATRIC: The patient is alert and oriented x 3.  SKIN: No obvious rash, lesion, or ulcer.   DATA REVIEW:   CBC  Recent Labs Lab 04/20/16 0327  WBC 18.0*  HGB 12.6  HCT 37.3  PLT 278    Chemistries   Recent Labs Lab 04/19/16 1111  NA 136  K 3.5  CL 100*  CO2 23  GLUCOSE 150*  BUN 13  CREATININE 0.94  CALCIUM 9.2  AST 40  ALT 20  ALKPHOS 73  BILITOT 0.2*    Cardiac Enzymes  Recent Labs Lab 04/19/16 1111  TROPONINI <0.03    Microbiology Results  @[email or web address] @  RADIOLOGY:  Ct Abdomen Pelvis W Contrast  Result Date: 04/19/2016 CLINICAL DATA:  Abdominal pain and elevated white count. History of dementia. Colostomy for bowel obstruction. EXAM: CT ABDOMEN AND PELVIS WITH CONTRAST TECHNIQUE: Multidetector CT imaging of the abdomen and pelvis was performed using the standard protocol following bolus administration of intravenous contrast. CONTRAST:  100mL ISOVUE-300 IOPAMIDOL (ISOVUE-300) INJECTION 61% COMPARISON:  03/15/2015 FINDINGS: Lower chest: There is bilateral lower lobe pneumonia and volume loss. No pleural fluid. Small pericardial effusion. Hepatobiliary: Previous cholecystectomy. No liver parenchymal lesion. Pancreas: Normal Spleen: Normal except for benign calcification. Adrenals/Urinary Tract: Adrenal glands are normal. Kidneys are normal except for a 1.5 cm simple cyst at the lower pole on the left. Stomach/Bowel: Left lower quadrant ostomy with associated hernia containing mesenteric fat. No obstruction at this site. Right anterior abdominal wall hernia with the defect measuring 6 cm in diameter, containing mesentery and nonobstructed small intestine. Vascular/Lymphatic: The aorta and IVC are normal. No retroperitoneal adenopathy or mass. Reproductive: Uterus and adnexal regions are normal. Mucous fistula appears unremarkable. Other: No free fluid or air.  Musculoskeletal: Ordinary degenerative changes affect the spine. IMPRESSION: Bilateral lower lobe pneumonia. Left lower quadrant ostomy with a small amount of associated herniated fat. No obstruction or complication otherwise. Right anterior abdominal wall hernias with the defect measuring 6 cm in diameter. Herniated small intestine and mesentery. No obstruction. Electronically Signed   By: Paulina FusiMark  Shogry M.D.   On: 04/19/2016 14:34   Dg Chest Portable 1 View  Result Date: 04/19/2016 CLINICAL DATA:  Initial evaluation for air possible pneumonia. EXAM: PORTABLE CHEST 1 VIEW COMPARISON:  Prior radiograph from 03/14/2015. FINDINGS: Cardiac and mediastinal silhouettes are stable in size and contour, and remain within normal limits. Lungs are hypoinflated. Secondary diffuse bronchovascular crowding with vascular congestion. Patchy bibasilar opacities at least in part reflect atelectasis and/ or bronchovascular crowding, however, superimposed infiltrates may be present as well. No pleural effusion. Vascular congestion without overt pulmonary edema. No pneumothorax. No acute osseous abnormality. IMPRESSION: Shallow lung inflation with patchy bibasilar opacities. While these findings at least in part reflect atelectasis/ bronchovascular crowding, possible infectious infiltrates may be present as well. Electronically Signed   By: Rise MuBenjamin  McClintock M.D.   On: 04/19/2016 15:24      Management plans discussed with the patient and she is in agreement.  Stable for discharge home  Patient should follow up with pcp  CODE STATUS:     Code Status Orders        Start     Ordered   04/19/16 1702  Full code  Continuous     04/19/16 1701    Code Status History    Date Active Date Inactive Code Status Order ID Comments User Context   03/15/2015  6:00 AM 03/16/2015  8:07 PM Full Code [id number]  Ida Rogue, MD Inpatient      TOTAL TIME TAKING CARE OF THIS PATIENT: 35 minutes.    Note: This  dictation was prepared with Dragon dictation along with smaller phrase technology. Any transcriptional errors that result from this process are unintentional.  Rumaldo Difatta M.D on 04/20/2016 at 11:49 AM  Between 7am to 6pm - Pager - [phone] After 6pm go to [email or web address] - password [email or web address]  [facility] [place] [facility]  Office  [phone]  CC: Primary care physician; BABAOFF, Lavada Mesi, MD

## 2016-04-20 NOTE — Care Management (Signed)
Physical therapy evaluation is completed. Recommends home with home health/physical therapy. Spoke with sister, Boyd Kerbsenny, at the bedside. Discussed home health agencies. Chose [facility] for services. Feliberto GottronJason Hinton, [facility] representative updated. Rolling walker will be arranged. Sister will transport. Discharge to home per Dr. Juliene PinaMody. Gwenette GreetBrenda S Sallyanne Birkhead RN MSN CCM Care Management [phone]

## 2016-04-20 NOTE — Progress Notes (Signed)
Patient d/ced home.  She worked well with PT and will be getting home health.  Home health provided walker to assist with mobility.  WBC is 18 which improved from 20.2.  Patient has colostomy.  Will be on PO Levaquin at home and family knows to call dr for temp > 100.4.  Patient lives with sister and brother in law.  Sister at first wanted her to stay, but then became concerned she might catch something else in the hospital.

## 2016-04-20 NOTE — Care Management (Signed)
Admitted to [facility] with the diagnosis of pneumonia. Lives with sister/legal guardian Will Bonnetehhy Taylor x 3 years. Seen Dr. Madelin RearBaboff yesterday. No home health. No skilled facility. No home oxygen. Needs help from family with dressing and baths. [facility]. Fell last week x 2. Good appetite. Uses a rolling walker to aid in ambulation.  Family will transport. Gwenette GreetBrenda S Sara Selvidge RN MSN CCM Care Management [phone]

## 2016-04-20 NOTE — Care Management Important Message (Signed)
Important Message  Patient Details  Name: Humberto SealsMelanie B Knoth MRN: [id number] Date of Birth: 1965/12/06   Medicare Important Message Given:  Yes    Gwenette GreetBrenda S Aniylah Avans, RN 04/20/2016, 11:11 AM

## 2016-04-21 DIAGNOSIS — J181 Lobar pneumonia, unspecified organism: Secondary | ICD-10-CM | POA: Diagnosis not present

## 2016-04-21 DIAGNOSIS — Z7982 Long term (current) use of aspirin: Secondary | ICD-10-CM | POA: Diagnosis not present

## 2016-04-21 DIAGNOSIS — Q909 Down syndrome, unspecified: Secondary | ICD-10-CM | POA: Diagnosis not present

## 2016-04-21 DIAGNOSIS — E039 Hypothyroidism, unspecified: Secondary | ICD-10-CM | POA: Diagnosis not present

## 2016-04-21 DIAGNOSIS — Q431 Hirschsprung's disease: Secondary | ICD-10-CM | POA: Diagnosis not present

## 2016-04-21 DIAGNOSIS — Z933 Colostomy status: Secondary | ICD-10-CM | POA: Diagnosis not present

## 2016-04-21 LAB — URINE CULTURE: SPECIAL REQUESTS: NORMAL

## 2016-04-24 LAB — CULTURE, BLOOD (ROUTINE X 2)
CULTURE: NO GROWTH
CULTURE: NO GROWTH

## 2016-04-25 DIAGNOSIS — J181 Lobar pneumonia, unspecified organism: Secondary | ICD-10-CM | POA: Diagnosis not present

## 2016-04-25 DIAGNOSIS — Z7982 Long term (current) use of aspirin: Secondary | ICD-10-CM | POA: Diagnosis not present

## 2016-04-25 DIAGNOSIS — Q909 Down syndrome, unspecified: Secondary | ICD-10-CM | POA: Diagnosis not present

## 2016-04-25 DIAGNOSIS — Z933 Colostomy status: Secondary | ICD-10-CM | POA: Diagnosis not present

## 2016-04-25 DIAGNOSIS — Q431 Hirschsprung's disease: Secondary | ICD-10-CM | POA: Diagnosis not present

## 2016-04-25 DIAGNOSIS — E039 Hypothyroidism, unspecified: Secondary | ICD-10-CM | POA: Diagnosis not present

## 2016-04-26 DIAGNOSIS — E039 Hypothyroidism, unspecified: Secondary | ICD-10-CM | POA: Diagnosis not present

## 2016-04-26 DIAGNOSIS — Z7982 Long term (current) use of aspirin: Secondary | ICD-10-CM | POA: Diagnosis not present

## 2016-04-26 DIAGNOSIS — Q431 Hirschsprung's disease: Secondary | ICD-10-CM | POA: Diagnosis not present

## 2016-04-26 DIAGNOSIS — J181 Lobar pneumonia, unspecified organism: Secondary | ICD-10-CM | POA: Diagnosis not present

## 2016-04-26 DIAGNOSIS — Z933 Colostomy status: Secondary | ICD-10-CM | POA: Diagnosis not present

## 2016-04-26 DIAGNOSIS — Q909 Down syndrome, unspecified: Secondary | ICD-10-CM | POA: Diagnosis not present

## 2016-04-27 DIAGNOSIS — Z933 Colostomy status: Secondary | ICD-10-CM | POA: Diagnosis not present

## 2016-04-27 DIAGNOSIS — Z7982 Long term (current) use of aspirin: Secondary | ICD-10-CM | POA: Diagnosis not present

## 2016-04-27 DIAGNOSIS — J181 Lobar pneumonia, unspecified organism: Secondary | ICD-10-CM | POA: Diagnosis not present

## 2016-04-27 DIAGNOSIS — Q909 Down syndrome, unspecified: Secondary | ICD-10-CM | POA: Diagnosis not present

## 2016-04-27 DIAGNOSIS — E039 Hypothyroidism, unspecified: Secondary | ICD-10-CM | POA: Diagnosis not present

## 2016-04-27 DIAGNOSIS — Q431 Hirschsprung's disease: Secondary | ICD-10-CM | POA: Diagnosis not present

## 2016-04-28 DIAGNOSIS — J189 Pneumonia, unspecified organism: Secondary | ICD-10-CM | POA: Diagnosis not present

## 2016-05-04 DIAGNOSIS — Q909 Down syndrome, unspecified: Secondary | ICD-10-CM | POA: Diagnosis not present

## 2016-05-04 DIAGNOSIS — E039 Hypothyroidism, unspecified: Secondary | ICD-10-CM | POA: Diagnosis not present

## 2016-05-04 DIAGNOSIS — Q431 Hirschsprung's disease: Secondary | ICD-10-CM | POA: Diagnosis not present

## 2016-05-04 DIAGNOSIS — J181 Lobar pneumonia, unspecified organism: Secondary | ICD-10-CM | POA: Diagnosis not present

## 2016-05-04 DIAGNOSIS — Z7982 Long term (current) use of aspirin: Secondary | ICD-10-CM | POA: Diagnosis not present

## 2016-05-04 DIAGNOSIS — Z933 Colostomy status: Secondary | ICD-10-CM | POA: Diagnosis not present

## 2016-05-26 DIAGNOSIS — Z933 Colostomy status: Secondary | ICD-10-CM | POA: Diagnosis not present

## 2016-06-17 DIAGNOSIS — J0100 Acute maxillary sinusitis, unspecified: Secondary | ICD-10-CM | POA: Diagnosis not present

## 2016-06-27 DIAGNOSIS — Z933 Colostomy status: Secondary | ICD-10-CM | POA: Diagnosis not present

## 2016-07-04 DIAGNOSIS — H5203 Hypermetropia, bilateral: Secondary | ICD-10-CM | POA: Diagnosis not present

## 2016-07-04 DIAGNOSIS — Z0100 Encounter for examination of eyes and vision without abnormal findings: Secondary | ICD-10-CM | POA: Diagnosis not present

## 2016-07-15 DIAGNOSIS — J0100 Acute maxillary sinusitis, unspecified: Secondary | ICD-10-CM | POA: Diagnosis not present

## 2016-07-17 ENCOUNTER — Encounter: Payer: Self-pay | Admitting: Emergency Medicine

## 2016-07-17 ENCOUNTER — Emergency Department: Payer: Medicare HMO

## 2016-07-17 ENCOUNTER — Emergency Department
Admission: EM | Admit: 2016-07-17 | Discharge: 2016-07-17 | Disposition: A | Discharge status: Home / Self Care | Payer: Medicare HMO | Attending: Emergency Medicine | Admitting: Emergency Medicine

## 2016-07-17 DIAGNOSIS — Z7982 Long term (current) use of aspirin: Secondary | ICD-10-CM | POA: Diagnosis not present

## 2016-07-17 DIAGNOSIS — R05 Cough: Secondary | ICD-10-CM | POA: Diagnosis not present

## 2016-07-17 DIAGNOSIS — J40 Bronchitis, not specified as acute or chronic: Secondary | ICD-10-CM | POA: Insufficient documentation

## 2016-07-17 DIAGNOSIS — Z79899 Other long term (current) drug therapy: Secondary | ICD-10-CM | POA: Diagnosis not present

## 2016-07-17 DIAGNOSIS — J069 Acute upper respiratory infection, unspecified: Secondary | ICD-10-CM | POA: Diagnosis not present

## 2016-07-17 LAB — URINALYSIS, COMPLETE (UACMP) WITH MICROSCOPIC
BACTERIA UA: NONE SEEN
Bilirubin Urine: NEGATIVE
Glucose, UA: NEGATIVE mg/dL
Ketones, ur: NEGATIVE mg/dL
Nitrite: NEGATIVE
PROTEIN: NEGATIVE mg/dL
SPECIFIC GRAVITY, URINE: 1.009 (ref 1.005–1.030)
pH: 6 (ref 5.0–8.0)

## 2016-07-17 LAB — INFLUENZA PANEL BY PCR (TYPE A & B)
INFLAPCR: NEGATIVE
Influenza B By PCR: NEGATIVE

## 2016-07-17 NOTE — ED Notes (Signed)
NAD noted at time of D/C. Pt d/c into the care of primary caregiver/sister Mayra ReelPenny Taylor. Pt's sister denies comments/concerns at this time. Pt ambulatory to the lobby without difficulty.

## 2016-07-17 NOTE — ED Triage Notes (Signed)
Pt with sister and POA, Lauren Nixon(Lauren Nixon) c/o URI type s/sx congestion with drainage, pt positive for pnx on Oct and guardian concerned that pnx is back.  Pt was seen at PCP on Friday and this am took tylenol (for arthritis), Z-pak and regular synthroid.    Pt with hx of multiple abdominal surgery to resolve Hirshsprungs disease and has colostomy.  Downs syndrome.

## 2016-07-17 NOTE — Discharge Instructions (Signed)
Your workup is most consistent with bronchitis. Please take your antibiotics and steroids as prescribed by your primary care doctor. Return to the emergency department for any worsening discomfort, chest pain, trouble breathing, or any other symptom personally concerning to yourself.

## 2016-07-17 NOTE — ED Provider Notes (Signed)
[facility] Emergency Department Provider Note  Time seen: 7:14 AM  I have reviewed the triage vital signs and the nursing notes.   HISTORY  Chief Complaint URI    HPI Lauren Nixon is a 51 y.o. female with a past medical history of Down syndrome, Hirschsprung's disease status post colostomy, gastric reflux, who presents to the emergency department with cough and some back discomfort. According to the family member she states the patient was diagnosed with pneumonia in October at that time the patient was experiencing back discomfort. Today the patient has been planning of back discomfort as well in addition the patient has been coughing for the past 3 or 4 days. Patient saw her primary care doctor 2 days ago and was diagnosed with an upper respiratory infection placed on prednisone and Zithromax. Patient denies any fever. Family member also states the patient complained of some pain when urinating recently.  Past Medical History:  Diagnosis Date  . Down's syndrome   . GERD (gastroesophageal reflux disease)   . Hirschsprung disease   . Thyroid disease   . Ventral hernia     Patient Active Problem List   Diagnosis Date Noted  . Pneumonia 04/19/2016  . Aftercare following surgery 03/20/2015  . 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 Disease of thyroid gland 03/19/2015  . Incarcerated ventral hernia 03/15/2015  . Partial bowel obstruction   . Ventral hernia with obstruction and without gangrene   . Allergic rhinitis, seasonal 12/23/2014  . Gastro-esophageal reflux disease without esophagitis 12/23/2014  . 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 COLOSTOMY    . VENTRAL HERNIA REPAIR N/A 03/15/2015   Procedure: VENTRAL HERNIA REPAIR FOR INCARCERATED VENTRAL HERNIA ;  Surgeon: Ida Roguehristopher Lundquist, MD;  Location: [facility] ORS;  Service: General;  Laterality: N/A;    Prior to Admission medications    Medication Sig Start Date End Date Taking? Authorizing Provider  acetaminophen (TYLENOL) 325 MG tablet Take 2 tablets (650 mg total) by mouth every 6 (six) hours as needed for mild pain or fever. 04/20/16  Yes Adrian SaranSital Mody, MD  aspirin EC 81 MG tablet Take 81 mg by mouth daily.   Yes Historical Provider, MD  azithromycin (ZITHROMAX) 250 MG tablet Take 250 mg by mouth daily. 07/15/16 07/19/16 Yes Historical Provider, MD  cetirizine (ZYRTEC) 10 MG tablet Take 10 mg by mouth daily.   Yes Historical Provider, MD  esomeprazole (NEXIUM) 40 MG capsule Take 1 capsule by mouth 2 (two) times daily before a meal.  02/11/15  Yes Historical Provider, MD  fluticasone (FLONASE) 50 MCG/ACT nasal spray Place 2 sprays into both nostrils daily.  02/11/15  Yes Historical Provider, MD  levothyroxine (SYNTHROID, LEVOTHROID) 75 MCG tablet Take 0.5 tablets by mouth daily. 12/23/14  Yes Historical Provider, MD  Melatonin 3 MG TABS Take 1 tablet by mouth at bedtime.   Yes Historical Provider, MD  Multiple Vitamin (MULTI-VITAMINS) TABS Take by mouth.   Yes Historical Provider, MD  predniSONE (DELTASONE) 10 MG tablet Take 10 mg by mouth daily.   Yes Historical Provider, MD    Allergies  Allergen Reactions  . Penicillins Other (See Comments)    Childhood allergy- sister does not know details    Family History  Problem Relation Age of Onset  . Hypertension Father     Social History Social History  Substance Use Topics  . Smoking status: Never Smoker  . Smokeless tobacco: Never Used  .  Alcohol use No    Review of Systems Constitutional: Negative for fever. Cardiovascular: Negative for chest pain. Respiratory: Negative for shortness of breath.Occasional cough. Gastrointestinal: Negative for abdominal pain Genitourinary:Possible dysuria. Neurological: Negative for headache 10-point ROS otherwise negative.  ____________________________________________   PHYSICAL EXAM:  VITAL SIGNS: ED Triage Vitals  Enc  Vitals Group     BP 07/17/16 0609 118/70     Pulse Rate 07/17/16 0609 98     Resp 07/17/16 0609 20     Temp 07/17/16 0609 97.8 F (36.6 C)     Temp Source 07/17/16 0609 Oral     SpO2 07/17/16 0609 98 %     Weight 07/17/16 0611 120 lb (54.4 kg)     Height 07/17/16 0611 4\' 10"  (1.473 m)     Head Circumference --      Peak Flow --      Pain Score --      Pain Loc --      Pain Edu? --      Excl. in GC? --     Constitutional: Alert and oriented. Well appearing and in no distress. Eyes: Normal exam ENT   Head: Normocephalic and atraumatic.   Mouth/Throat: Mucous membranes are moist. Cardiovascular: Normal rate, regular rhythm. No murmur Respiratory: Normal respiratory effort without tachypnea nor retractions. Breath sounds are clear Gastrointestinal: Soft and nontender. No distention.   Musculoskeletal: Nontender with normal range of motion in all extremities.  Neurologic:  Normal speech and language. No gross focal neurologic deficits are appreciated. Skin:  Skin is warm, dry and intact.  Psychiatric: Mood and affect are normal. ____________________________________________   INITIAL IMPRESSION / ASSESSMENT AND PLAN / ED COURSE  Pertinent labs & imaging results that were available during my care of the patient were reviewed by me and considered in my medical decision making (see chart for details).  Patient presents to the emergency department with back discomfort and cough. No back discomfort currently. Clear lung sounds bilaterally. No cough during examination. Urinalysis negative.  Influenza negative. Urinalysis negative. Chest x-ray shows peribronchial cuffing most consistent with bronchitis. Patient is currently on Zithromax and prednisone which is appropriate treatment. We will discharge the patient home with PCP follow-up in 2-3 days. Patient and family are agreeable.  ____________________________________________   FINAL CLINICAL IMPRESSION(S) / ED  DIAGNOSES  URI Bronchitis   Minna Antis, MD 07/17/16 [phone]

## 2016-07-17 NOTE — ED Notes (Signed)
Pt returned from Chest X-Ray. This RN to bedside to introduce self to patient and her sister (her POA). Pt is noted to be alert, pleasant and cooperative. Pt's sister reports upper back pain that woke her up this morning, pt's sister reports finding her sitting on her bed at approx 0430 crying. Pt's sister also reports that patient recently had pneumonia with same symptoms and is currently on a z-pack for URI/Sinus infection. Pt's sister reports non-productive cough at this time. Pt is alert and at baseline at this time.

## 2016-07-18 DIAGNOSIS — N390 Urinary tract infection, site not specified: Secondary | ICD-10-CM | POA: Diagnosis not present

## 2016-07-18 DIAGNOSIS — R300 Dysuria: Secondary | ICD-10-CM | POA: Diagnosis not present

## 2016-07-19 ENCOUNTER — Emergency Department: Payer: Medicare HMO

## 2016-07-19 ENCOUNTER — Encounter: Payer: Self-pay | Admitting: Emergency Medicine

## 2016-07-19 ENCOUNTER — Emergency Department
Admission: EM | Admit: 2016-07-19 | Discharge: 2016-07-19 | Disposition: A | Discharge status: Home / Self Care | Payer: Medicare HMO | Attending: Emergency Medicine | Admitting: Emergency Medicine

## 2016-07-19 DIAGNOSIS — R109 Unspecified abdominal pain: Secondary | ICD-10-CM | POA: Diagnosis present

## 2016-07-19 DIAGNOSIS — N390 Urinary tract infection, site not specified: Secondary | ICD-10-CM | POA: Diagnosis not present

## 2016-07-19 DIAGNOSIS — R319 Hematuria, unspecified: Secondary | ICD-10-CM

## 2016-07-19 DIAGNOSIS — K439 Ventral hernia without obstruction or gangrene: Secondary | ICD-10-CM | POA: Diagnosis not present

## 2016-07-19 DIAGNOSIS — M545 Low back pain, unspecified: Secondary | ICD-10-CM

## 2016-07-19 DIAGNOSIS — R3989 Other symptoms and signs involving the genitourinary system: Secondary | ICD-10-CM

## 2016-07-19 DIAGNOSIS — N898 Other specified noninflammatory disorders of vagina: Secondary | ICD-10-CM | POA: Diagnosis not present

## 2016-07-19 LAB — URINALYSIS, COMPLETE (UACMP) WITH MICROSCOPIC
BILIRUBIN URINE: NEGATIVE
Glucose, UA: NEGATIVE mg/dL
KETONES UR: NEGATIVE mg/dL
Nitrite: NEGATIVE
PROTEIN: 30 mg/dL — AB
Specific Gravity, Urine: 1.015 (ref 1.005–1.030)
pH: 6 (ref 5.0–8.0)

## 2016-07-19 MED ORDER — METRONIDAZOLE 500 MG PO TABS: 500.0000 mg | ORAL_TABLET | Freq: Three times a day (TID) | ORAL | 0 refills | Status: AC

## 2016-07-19 MED ORDER — HYDROCODONE-ACETAMINOPHEN 5-325 MG PO TABS: 1.0000 | ORAL_TABLET | ORAL | 0 refills | Status: DC | PRN

## 2016-07-19 MED ORDER — CEPHALEXIN 500 MG PO CAPS: 500.0000 mg | ORAL_CAPSULE | Freq: Three times a day (TID) | ORAL | 0 refills | Status: DC

## 2016-07-19 MED ORDER — FLUCONAZOLE 150 MG PO TABS: 150.0000 mg | ORAL_TABLET | Freq: Once | ORAL | 0 refills | Status: AC

## 2016-07-19 MED ORDER — CEPHALEXIN 500 MG PO CAPS
500.0000 mg | ORAL_CAPSULE | Freq: Once | ORAL | Status: AC
  Administered 2016-07-19: 500 mg via ORAL
  Filled 2016-07-19: qty 1

## 2016-07-19 MED ORDER — METRONIDAZOLE 500 MG PO TABS
500.0000 mg | ORAL_TABLET | Freq: Once | ORAL | Status: AC
  Administered 2016-07-19: 500 mg via ORAL
  Filled 2016-07-19: qty 1

## 2016-07-19 NOTE — ED Notes (Signed)
Patient had an episode of milky pink vaginal discharge at this time.  Family reports this is very abnormal.

## 2016-07-19 NOTE — ED Notes (Signed)
ED Provider at bedside. 

## 2016-07-19 NOTE — ED Provider Notes (Addendum)
[facility] Emergency Department Provider Note  ____________________________________________   First MD Initiated Contact with Patient 07/19/16 1644     (approximate)  I have reviewed the triage vital signs and the nursing notes.   HISTORY  Chief Complaint Dysuria and Flank Pain  History is limited by the patient's Down syndrome and is obtained primarily from her relatives  HPI Lauren Nixon is a 51 y.o. female with a history of Down syndrome, Hirschsprung disease status post colostomy, and the associated debility associated with Down's syndrome such as early onset dementia.  She presents with her sister and sister-in-law for evaluation of lower back pain and dysuria as well as urinary or vaginal discharge.  Reportedly she was complaining of low back pain last night during the night that was extremely severe.  Tylenol did not make it better.  However from 8 AM on she had no pain and continues to be in no pain and no discomfort.  She started having dysuria today and the sister and sister-in-law report that she has copious whitish discharge that looks like pus when she urinates.  They are unsure if it is coming from the vagina or from her urine.  She continues to not be in any discomfort but she seems to continue to have some urinary leakage.  She is not complaining of abdominal pain, nausea, vomiting, chest pain, fever/chills, shortness of breath.  Her symptoms are severe and nothing seems to make them better or worse but she is currently not in any pain and has not been in pain for about 9 hours.  She recently was started on Cipro for the low back pain thought to be possibly due to a urinary tract infection but she is only had 2 doses of medication.   Past Medical History:  Diagnosis Date  . Down's syndrome   . GERD (gastroesophageal reflux disease)   . Hirschsprung disease   . Thyroid disease   . Ventral hernia     Patient Active Problem List   Diagnosis  Date Noted  . Pneumonia 04/19/2016  . Aftercare following surgery 03/20/2015  . 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 Disease of thyroid gland 03/19/2015  . Incarcerated ventral hernia 03/15/2015  . Partial bowel obstruction   . Ventral hernia with obstruction and without gangrene   . Allergic rhinitis, seasonal 12/23/2014  . Gastro-esophageal reflux disease without esophagitis 12/23/2014  . 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 COLOSTOMY    . VENTRAL HERNIA REPAIR N/A 03/15/2015   Procedure: VENTRAL HERNIA REPAIR FOR INCARCERATED VENTRAL HERNIA ;  Surgeon: Ida Rogue, MD;  Location: [facility] ORS;  Service: General;  Laterality: N/A;    Prior to Admission medications   Medication Sig Start Date End Date Taking? Authorizing Provider  acetaminophen (TYLENOL) 325 MG tablet Take 2 tablets (650 mg total) by mouth every 6 (six) hours as needed for mild pain or fever. 04/20/16   Adrian Saran, MD  aspirin EC 81 MG tablet Take 81 mg by mouth daily.    Historical Provider, MD  azithromycin (ZITHROMAX) 250 MG tablet Take 250 mg by mouth daily. 07/15/16 07/19/16  Historical Provider, MD  cephALEXin (KEFLEX) 500 MG capsule Take 1 capsule (500 mg total) by mouth 3 (three) times daily. 07/19/16   Loleta Rose, MD  cetirizine (ZYRTEC) 10 MG tablet Take 10 mg by mouth daily.    Historical Provider, MD  esomeprazole (NEXIUM) 40 MG capsule Take 1 capsule  by mouth 2 (two) times daily before a meal.  02/11/15   Historical Provider, MD  fluconazole (DIFLUCAN) 150 MG tablet Take 1 tablet (150 mg total) by mouth once. If you still have symptoms after 48 hours, take the second pill by mouth. 07/19/16 07/19/16  Loleta Roseory Sigfredo Schreier, MD  fluticasone (FLONASE) 50 MCG/ACT nasal spray Place 2 sprays into both nostrils daily.  02/11/15   Historical Provider, MD  HYDROcodone-acetaminophen (NORCO/VICODIN) 5-325 MG tablet Take 1-2 tablets by mouth every 4 (four) hours as needed  for moderate pain. 07/19/16   Loleta Roseory Parneet Glantz, MD  levothyroxine (SYNTHROID, LEVOTHROID) 75 MCG tablet Take 0.5 tablets by mouth daily. 12/23/14   Historical Provider, MD  Melatonin 3 MG TABS Take 1 tablet by mouth at bedtime.    Historical Provider, MD  metroNIDAZOLE (FLAGYL) 500 MG tablet Take 1 tablet (500 mg total) by mouth 3 (three) times daily. 07/19/16 07/29/16  Loleta Roseory Yanai Hobson, MD  Multiple Vitamin (MULTI-VITAMINS) TABS Take by mouth.    Historical Provider, MD  predniSONE (DELTASONE) 10 MG tablet Take 10 mg by mouth daily.    Historical Provider, MD    Allergies Penicillins  Family History  Problem Relation Age of Onset  . Hypertension Father     Social History Social History  Substance Use Topics  . Smoking status: Never Smoker  . Smokeless tobacco: Never Used  . Alcohol use No    Review of Systems Constitutional: No fever/chills Eyes: No visual changes. ENT: No sore throat. Cardiovascular: Denies chest pain. Respiratory: Denies shortness of breath. Gastrointestinal: No abdominal pain.  No nausea, no vomiting.  No diarrhea.  No constipation. Genitourinary: Dysuria and discharge Musculoskeletal: +back pain. Skin: Negative for rash. Neurological: Negative for headaches, focal weakness or numbness.  10-point ROS otherwise negative.  ____________________________________________   PHYSICAL EXAM:  VITAL SIGNS: ED Triage Vitals  Enc Vitals Group     BP 07/19/16 1444 98/65     Pulse Rate 07/19/16 1444 92     Resp 07/19/16 1444 18     Temp 07/19/16 1444 97.9 F (36.6 C)     Temp Source 07/19/16 1444 Oral     SpO2 07/19/16 1444 99 %     Weight 07/19/16 1447 120 lb (54.4 kg)     Height 07/19/16 1447 4\' 8"  (1.422 m)     Head Circumference --      Peak Flow --      Pain Score 07/19/16 1449 2     Pain Loc --      Pain Edu? --      Excl. in GC? --     Constitutional: Alert, Cooperative and conversational with me, denies any pain or distress.  Appears very comfortable and  family reports that she is at her baseline Eyes: Conjunctivae are normal. PERRL. EOMI. Head: Atraumatic. Nose: No congestion/rhinnorhea. Mouth/Throat: Mucous membranes are moist.  Oropharynx non-erythematous. Neck: No stridor.  No meningeal signs.   Cardiovascular: Normal rate, regular rhythm. Good peripheral circulation. Grossly normal heart sounds. Respiratory: Normal respiratory effort.  No retractions. Lungs CTAB. Gastrointestinal: Soft and nontender. No distention.  Colostomy present with normal-appearing output. Genitourinary: Deferred Musculoskeletal: No lower extremity tenderness nor edema. No gross deformities of extremities. Neurologic:  Normal speech and language. No gross focal neurologic deficits are appreciated.  Skin:  Skin is warm, dry and intact. No rash noted.   ____________________________________________   LABS (all labs ordered are listed, but only abnormal results are displayed)  Labs Reviewed  URINALYSIS, COMPLETE (UACMP) WITH  MICROSCOPIC - Abnormal; Notable for the following:       Result Value   Color, Urine YELLOW (*)    APPearance CLOUDY (*)    Hgb urine dipstick MODERATE (*)    Protein, ur 30 (*)    Leukocytes, UA LARGE (*)    Bacteria, UA RARE (*)    Squamous Epithelial / LPF 0-5 (*)    All other components within normal limits  URINE CULTURE   ____________________________________________  EKG  None - EKG not ordered by ED physician ____________________________________________  RADIOLOGY   Ct Renal Stone Study  Result Date: 07/19/2016 CLINICAL DATA:  51 year old female with bilateral lower back pain and dysuria. Down syndrome, dementia. Prior colostomy. Hirschsprung's''s disease. Ventral hernia. Initial encounter. EXAM: CT ABDOMEN AND PELVIS WITHOUT CONTRAST TECHNIQUE: Multidetector CT imaging of the abdomen and pelvis was performed following the standard protocol without IV contrast. COMPARISON:  04/19/2016 CT. FINDINGS: Lower chest: Lung  bases clear. Heart size within normal limits. Small pericardial fluid. Hepatobiliary: Mild fatty infiltration of the liver. Taking into account limitation by non contrast imaging, no worrisome mass. Post cholecystectomy. Pancreas: Taking into account limitation by non contrast imaging, no mass or inflammation. Spleen: Calcified granulomas. Adrenals/Urinary Tract: No renal or ureteral obstructing stone or hydronephrosis. Taking into account limitation by non contrast imaging, no worrisome renal mass. Stomach/Bowel: Evaluation of the stomach limited by under distension. Large anterior right abdominal wall hernia containing small and large bowel. Mesenteric, omentum, vessel and fat extend into this region. This does not appear to be causing obstruction. Colostomy left lower quadrant containing prominent amount of fat. Inflammation surrounds the rectosigmoid blind loop and cervix/lower uterus. Vascular/Lymphatic: No abdominal aortic aneurysm.  No adenopathy. Reproductive: Inflammation surrounds the lower uterus/ cervix as noted above Other: No free intraperitoneal air. Musculoskeletal: Degenerative changes without worrisome osseous abnormality. IMPRESSION: Colostomy left lower quadrant contains a large amount of fat. Inflammation surrounds the rectosigmoid blind loop and cervix/lower uterus. Etiology indeterminate. Large anterior right abdominal wall hernia containing small and large bowel. This does not appear to be causing obstruction. Electronically Signed   By: Lacy Duverney M.D.   On: 07/19/2016 15:36    ____________________________________________   PROCEDURES  Procedure(s) performed:   Procedures   Critical Care performed: No ____________________________________________   INITIAL IMPRESSION / ASSESSMENT AND PLAN / ED COURSE  Pertinent labs & imaging results that were available during my care of the patient were reviewed by me and considered in my medical decision making (see chart for  details).  The patient's family was extremely concerned about the discharge they are saying and whether or not it was from the vagina or the urine.  Her urinalysis suggests a urinary tract infection which should be consistent with the symptoms.  I had multiple extended conversations with the family about other possibilities and I went over in detail with them the results of the CT scan.  I also called and spoke by phone with Dr. Chauncey Cruel with OB/GYN and with Dr. Excell Seltzer with general surgery.  Both of those specialists reviewed the CT scans given the results that showed some nonspecific inflammation around the rectosigmoid bowel loop, cervix, and uterus.  None of [place] feel that this represents an acute surgical or infectious emergency and her vital signs are reassuring.  She has been without pain for about 9 hours.  She has no abdominal tenderness to palpation.  They both agree that empiric broad-spectrum antibiotics would be appropriate under the circumstances.  I did discuss the possibility  of a pelvic exam with the patient's sister.  Over, she reports that the patient had one attempt at a pelvic exam several years ago and it was extraordinarily traumatic for her.  We all agreed that putting her through an unnecessary exam could cause more harm than good, particularly because they all agreed that the patient is not sexually active.  I will cover her with Keflex, Flagyl, and also gave 2 pills worth of Diflucan to cover the possibility of a yeast infection, or even a yeast infection that may develop as result of antibiotics.  Due to local  Microbial resistance patterns and recommendations by infectious disease, I recommended switching her from Cipro to Keflex in addition to starting the Flagyl.  I gave her first dose of Flagyl and Keflex in the emergency department.  They will follow up as an outpatient.  I gave my usual and customary return precautions.  She was ambulatory with no reports of pain or tenderness at  the time of discharge.  Urine culture was ordered.    ____________________________________________  FINAL CLINICAL IMPRESSION(S) / ED DIAGNOSES  Final diagnoses:  Urinary tract infection with hematuria, site unspecified  Low back pain without sciatica, unspecified back pain laterality, unspecified chronicity  Abnormal urogenital discharge     MEDICATIONS GIVEN DURING THIS VISIT:  Medications  metroNIDAZOLE (FLAGYL) tablet 500 mg (500 mg Oral Given 07/19/16 1819)  cephALEXin (KEFLEX) capsule 500 mg (500 mg Oral Given 07/19/16 1819)     NEW OUTPATIENT MEDICATIONS STARTED DURING THIS VISIT:  Discharge Medication List as of 07/19/2016  6:43 PM    START taking these medications   Details  cephALEXin (KEFLEX) 500 MG capsule Take 1 capsule (500 mg total) by mouth 3 (three) times daily., Starting Tue 07/19/2016, Print    fluconazole (DIFLUCAN) 150 MG tablet Take 1 tablet (150 mg total) by mouth once. If you still have symptoms after 48 hours, take the second pill by mouth., Starting Tue 07/19/2016, Print    HYDROcodone-acetaminophen (NORCO/VICODIN) 5-325 MG tablet Take 1-2 tablets by mouth every 4 (four) hours as needed for moderate pain., Starting Tue 07/19/2016, Print    metroNIDAZOLE (FLAGYL) 500 MG tablet Take 1 tablet (500 mg total) by mouth 3 (three) times daily., Starting Tue 07/19/2016, Until Fri 07/29/2016, Print        Discharge Medication List as of 07/19/2016  6:43 PM      Discharge Medication List as of 07/19/2016  6:43 PM       Note:  This document was prepared using Dragon voice recognition software and may include unintentional dictation errors.    Loleta Rose, MD 07/19/16 1905    Loleta Rose, MD 07/19/16 Ebony Cargo

## 2016-07-19 NOTE — Discharge Instructions (Signed)
As we discussed, Lauren Nixon's CT scan showed some inflammation around the rectosigmoid bowel loop, cervix, and uterus, but there was no sign of abscess or fluid collection. Her scan was otherwise reassuring.  Her urinalysis was positive for infection, and we sent a culture.  Because of the patterns of resistance seen by the local infectious disease doctor, we recommend switching her from Cipro to Keflex (cephalexin) and also starting her on Flagyl (metronidazole).  Additionally, because people often get yeast infections while taking antibiotics, we prescribed a dose of medication called Diflucan which may help if she continues to have vaginal discharge and/or itching and burning.  We all agreed to hold off on a pelvic exam at this time since it would be traumatic for her and unlikely to be helpful.  If she continues to have issues, we recommend you follow up with Dr. Madelin RearBaboff, go see a GYN specialist in clinic (we provided a name and number), or return to the emergency department if you develop new or worsening symptoms that concern you.  We recommend using over the counter ibuprofen and/or Tylenol for discomfort.  Take Norco as prescribed for severe pain if absolutely necessary. Do not drink alcohol, drive or participate in any other potentially dangerous activities while taking this medication as it may make you sleepy. Do not take this medication with any other sedating medications, either prescription or over-the-counter. If you were prescribed Percocet or Vicodin, do not take these with acetaminophen (Tylenol) as it is already contained within these medications.   This medication is an opiate (or narcotic) pain medication and can be habit forming.  Use it as little as possible to achieve adequate pain control.  Do not use or use it with extreme caution if you have a history of opiate abuse or dependence.  If you are on a pain contract with your primary care doctor or a pain specialist, be sure to let them know  you were prescribed this medication today from the [facility].  This medication is intended for your use only - do not give any to anyone else and keep it in a secure place where nobody else, especially children, have access to it.  It will also cause or worsen constipation, so you may want to consider taking an over-the-counter stool softener while you are taking this medication.

## 2016-07-19 NOTE — ED Triage Notes (Signed)
Patient presents to the ED with lower back pain and dysuria.  Patient has had urinary frequency and thick urine and thick discharge.  Patient currently is denying pain.  Patient has history of down syndrome and dementia and also has a colostomy.  Patient is in no obvious distress at this time.

## 2016-07-21 DIAGNOSIS — N760 Acute vaginitis: Secondary | ICD-10-CM | POA: Diagnosis not present

## 2016-07-21 LAB — URINE CULTURE: Special Requests: NORMAL

## 2016-07-27 DIAGNOSIS — Z933 Colostomy status: Secondary | ICD-10-CM | POA: Diagnosis not present

## 2016-08-11 ENCOUNTER — Ambulatory Visit (INDEPENDENT_AMBULATORY_CARE_PROVIDER_SITE_OTHER): Payer: Medicare HMO | Admitting: Surgery

## 2016-08-11 ENCOUNTER — Encounter: Payer: Self-pay | Admitting: Surgery

## 2016-08-11 VITALS — BP 109/77 | HR 94 | Temp 98.4°F | Ht <= 58 in | Wt 117.6 lb

## 2016-08-11 DIAGNOSIS — K432 Incisional hernia without obstruction or gangrene: Secondary | ICD-10-CM

## 2016-08-11 NOTE — Patient Instructions (Signed)
Please call our office if you have questions or concerns.   

## 2016-08-12 NOTE — Progress Notes (Signed)
Outpatient Surgical Follow Up  08/12/2016  Lauren Nixon is an 51 y.o. female.   Chief Complaint  Patient presents with  . Follow-up    HPI:  Lauren Nixon is a 51 year old female with Down syndrome and dementia. She has a large right lower quadrant ventral hernia that is recurrent and a left parastomal hernia. And recently she was admitted for double pneumonia and was in the hospital for a while. She is now recovering from this. From a GI perspective she is doing well she is taking by mouth, no nausea no vomiting. No significant abdominal pain and her colostomy is working well. No fevers no chills and she continues to improve from her pneumonia. Further workup 3 weeks ago a CT scan of the abdomen was done with renal protocol. I have personally reviewed the study There is a large recurrent right lower quadrant ventral hernia no evidence of incarceration or strangulation. There is also a large left parastomal hernia without evidence of strangulation or incarceration. Note acute intra-abdominal abnormality is no evidence of free air  Past Medical History:  Diagnosis Date  . Down's syndrome   . GERD (gastroesophageal reflux disease)   . Hirschsprung disease   . Thyroid disease   . Ventral hernia     Past Surgical History:  Procedure Laterality Date  . COLOSTOMY    . VENTRAL HERNIA REPAIR N/A 03/15/2015   Procedure: VENTRAL HERNIA REPAIR FOR INCARCERATED VENTRAL HERNIA ;  Surgeon: Ida Rogue, MD;  Location: [facility] ORS;  Service: General;  Laterality: N/A;    Family History  Problem Relation Age of Onset  . Hypertension Father     Social History:  reports that she has never smoked. She has never used smokeless tobacco. She reports that she does not drink alcohol or use drugs.  Allergies:  Allergies  Allergen Reactions  . Penicillins Other (See Comments)    Childhood allergy- sister does not know details    Medications reviewed.    ROS Full ROS performed and is otherwise  negative   BP 109/77   Pulse 94   Temp 98.4 F (36.9 C) (Oral)   Ht 4\' 8"  (1.422 m)   Wt 53.3 kg (117 lb 9.6 oz)   BMI 26.37 kg/m   Physical Exam  Constitutional: She is oriented to person, place, and time. No distress.  NAD but a bit debilitated  Eyes: Right eye exhibits no discharge. Left eye exhibits no discharge. No scleral icterus.  Neck: Normal range of motion. No JVD present.  Cardiovascular: Normal rate and intact distal pulses.   Pulmonary/Chest: Effort normal. No respiratory distress. She exhibits no tenderness.  Abdominal: Soft. She exhibits no distension. There is no tenderness. There is no rebound and no guarding.  Neurological: She is alert and oriented to person, place, and time. Gait normal. GCS score is 15.  Skin: Skin is warm and dry. She is not diaphoretic.  Psychiatric: Mood, memory, affect and judgment normal.  Nursing note and vitals reviewed.      No results found for this or any previous visit (from the past 48 hour(s)). No results found.  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 Currently asymptomatic. Given all her comorbidities I do think that she is at significant perioperative risk and likely will not do well with general endotracheal anesthesia. More importantly she  just develop bilateral pneumonia that threatened her life. For all these reasons I  think that the best course of action is watchful waiting. I will only operate on her if she perforates or strangulation. Discussed with the family in detail and they understand    Sterling Bigiego Janina Trafton, MD [facility] General Surgeon  08/12/2016,9:36 AM

## 2016-08-26 DIAGNOSIS — Z933 Colostomy status: Secondary | ICD-10-CM | POA: Diagnosis not present

## 2016-09-26 ENCOUNTER — Emergency Department
Admission: EM | Admit: 2016-09-26 | Discharge: 2016-09-26 | Disposition: A | Discharge status: Home / Self Care | Payer: Medicare HMO | Attending: Emergency Medicine | Admitting: Emergency Medicine

## 2016-09-26 ENCOUNTER — Emergency Department: Payer: Medicare HMO

## 2016-09-26 ENCOUNTER — Encounter: Payer: Self-pay | Admitting: Emergency Medicine

## 2016-09-26 DIAGNOSIS — R102 Pelvic and perineal pain: Secondary | ICD-10-CM | POA: Diagnosis not present

## 2016-09-26 DIAGNOSIS — Z933 Colostomy status: Secondary | ICD-10-CM | POA: Diagnosis not present

## 2016-09-26 DIAGNOSIS — Z7982 Long term (current) use of aspirin: Secondary | ICD-10-CM | POA: Insufficient documentation

## 2016-09-26 DIAGNOSIS — R569 Unspecified convulsions: Secondary | ICD-10-CM

## 2016-09-26 DIAGNOSIS — R4182 Altered mental status, unspecified: Secondary | ICD-10-CM | POA: Diagnosis not present

## 2016-09-26 DIAGNOSIS — R8299 Other abnormal findings in urine: Secondary | ICD-10-CM | POA: Insufficient documentation

## 2016-09-26 DIAGNOSIS — R6889 Other general symptoms and signs: Secondary | ICD-10-CM | POA: Diagnosis not present

## 2016-09-26 DIAGNOSIS — N898 Other specified noninflammatory disorders of vagina: Secondary | ICD-10-CM

## 2016-09-26 LAB — COMPREHENSIVE METABOLIC PANEL
ALT: 14 U/L (ref 14–54)
AST: 31 U/L (ref 15–41)
Albumin: 3.8 g/dL (ref 3.5–5.0)
Alkaline Phosphatase: 63 U/L (ref 38–126)
Anion gap: 8 (ref 5–15)
BUN: 11 mg/dL (ref 6–20)
CHLORIDE: 103 mmol/L (ref 101–111)
CO2: 25 mmol/L (ref 22–32)
Calcium: 9.4 mg/dL (ref 8.9–10.3)
Creatinine, Ser: 0.91 mg/dL (ref 0.44–1.00)
GFR calc Af Amer: >60 mL/min (ref >60)
Glucose, Bld: 92 mg/dL (ref 65–99)
POTASSIUM: 3.7 mmol/L (ref 3.5–5.1)
SODIUM: 136 mmol/L (ref 135–145)
Total Bilirubin: 0.7 mg/dL (ref 0.3–1.2)
Total Protein: 8.2 g/dL — ABNORMAL HIGH (ref 6.5–8.1)

## 2016-09-26 LAB — CBC WITH DIFFERENTIAL/PLATELET
BASOS ABS: 0.1 10*3/uL (ref 0–0.1)
Basophils Relative: 1 %
Eosinophils Absolute: 0.1 10*3/uL (ref 0–0.7)
Eosinophils Relative: 1 %
HCT: 46.2 % (ref 35.0–47.0)
Hemoglobin: 15.6 g/dL (ref 12.0–16.0)
LYMPHS ABS: 1 10*3/uL (ref 1.0–3.6)
LYMPHS PCT: 15 %
MCH: 31.4 pg (ref 26.0–34.0)
MCHC: 33.8 g/dL (ref 32.0–36.0)
MCV: 92.7 fL (ref 80.0–100.0)
MONO ABS: 0.4 10*3/uL (ref 0.2–0.9)
Monocytes Relative: 6 %
Neutro Abs: 4.9 10*3/uL (ref 1.4–6.5)
Neutrophils Relative %: 77 %
PLATELETS: 398 10*3/uL (ref 150–440)
RBC: 4.98 MIL/uL (ref 3.80–5.20)
RDW: 13.9 % (ref 11.5–14.5)
WBC: 6.5 10*3/uL (ref 3.6–11.0)

## 2016-09-26 LAB — URINALYSIS, COMPLETE (UACMP) WITH MICROSCOPIC
Bacteria, UA: NONE SEEN
Bilirubin Urine: NEGATIVE
Glucose, UA: NEGATIVE mg/dL
Ketones, ur: NEGATIVE mg/dL
NITRITE: NEGATIVE
PH: 5 (ref 5.0–8.0)
Protein, ur: 30 mg/dL — AB
SPECIFIC GRAVITY, URINE: 1.017 (ref 1.005–1.030)

## 2016-09-26 LAB — TROPONIN I

## 2016-09-26 MED ORDER — DIAZEPAM 5 MG PO TABS
5.0000 mg | ORAL_TABLET | Freq: Once | ORAL | Status: AC
Start: 2016-09-26 — End: 2016-09-26
  Administered 2016-09-26: 5 mg via ORAL
  Filled 2016-09-26: qty 1

## 2016-09-26 MED ORDER — METOCLOPRAMIDE HCL 5 MG/ML IJ SOLN: 5.0000 mg | Freq: Once | INTRAMUSCULAR | Status: DC

## 2016-09-26 MED ORDER — DIAZEPAM 5 MG PO TABS
5.0000 mg | ORAL_TABLET | Freq: Once | ORAL | Status: AC
  Administered 2016-09-26: 5 mg via ORAL
  Filled 2016-09-26: qty 1

## 2016-09-26 NOTE — ED Notes (Signed)
Pt sitting up at the bedside eating crackers and peanut butter with coke soda to drink.. Family is at the bedside assisting pt.. Will continue to monitor the pt.. EDP is awaiting neuro consult.

## 2016-09-26 NOTE — ED Triage Notes (Signed)
Pt. gardian states pt. Was coughing or aspirating this morning.

## 2016-09-26 NOTE — Progress Notes (Signed)
Chaplain received a page for a Code Stroke Pt who had requested for an AD. Dwight met Pt. Pt's family members were at bedside. Pt's sister stated that Pt has an AD but wanted to have a DNR. [place] informed Pt and family that doing another AD was not necessary at this point since Pt has a valid AD. [place] advised the Pt and family to contact the Pt's primary Dc to do an DNR for the Pt. Pt's family thanked the [facility] for the information given to them. [place] informed the Pt and family that [facility] services were available if needed.    09/26/16 1000  Clinical Encounter Type  Visited With Patient;Patient and family together  Visit Type Initial;ED  Referral From Nurse  Consult/Referral To Chaplain  Spiritual Encounters  Spiritual Needs Literature;Brochure;Other (Comment)

## 2016-09-26 NOTE — ED Notes (Signed)
Portable chest xray performed. 

## 2016-09-26 NOTE — ED Notes (Signed)
Patient transported to CT 

## 2016-09-26 NOTE — ED Provider Notes (Signed)
[facility] Emergency Department Provider Note       Time seen: ----------------------------------------- 7:27 AM on 09/26/2016 -----------------------------------------  L5 caveat: Review of systems and history is limited by Down's syndrome and dementia   I have reviewed the triage vital signs and the nursing notes.   HISTORY   Chief Complaint Weakness (Pt. here via EMS for near syncope.)    HPI Lauren Nixon is a 51 y.o. female who presents to the ED for altered mental status. Family reports an episode where she had very labored breathing and she was unconscious and not following commands. It was reported that her eyes were rolled backwards she may have had some shaking but overall was having trouble breathing. Initially it was thought that she was choking. She has not been sick recently, no changes in her medications. She has never had a seizure before.   Past Medical History:  Diagnosis Date  . Down's syndrome   . GERD (gastroesophageal reflux disease)   . Hirschsprung disease   . Thyroid disease   . Ventral hernia     Patient Active Problem List   Diagnosis Date Noted  . Pneumonia 04/19/2016  . Pure hypercholesterolemia 03/29/2016  . Aftercare following surgery 03/20/2015  . 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 Disease of thyroid gland 03/19/2015  . Incarcerated ventral hernia 03/15/2015  . Partial bowel obstruction   . Ventral hernia with obstruction and without gangrene   . Allergic rhinitis, seasonal 12/23/2014  . Gastro-esophageal reflux disease without esophagitis 12/23/2014  . 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 COLOSTOMY    . VENTRAL HERNIA REPAIR N/A 03/15/2015   Procedure: VENTRAL HERNIA REPAIR FOR INCARCERATED VENTRAL HERNIA ;  Surgeon: Ida Rogue, MD;  Location: [facility] ORS;  Service: General;  Laterality: N/A;    Allergies Penicillins  Social  History Social History  Substance Use Topics  . Smoking status: Never Smoker  . Smokeless tobacco: Never Used  . Alcohol use No    Review of Systems Reported altered mental status, difficulty breathing and possibly choking No further information is available.  ____________________________________________   PHYSICAL EXAM:  VITAL SIGNS: ED Triage Vitals  Enc Vitals Group     BP --      Pulse Rate 09/26/16 0704 91     Resp 09/26/16 0704 18     Temp 09/26/16 0704 97.6 F (36.4 C)     Temp src --      SpO2 09/26/16 0704 97 %     Weight 09/26/16 0705 120 lb (54.4 kg)     Height 09/26/16 0705  (1.422 m)     Head Circumference --      Peak Flow --      Pain Score --      Pain Loc --      Pain Edu? --      Excl. in GC? --     Constitutional: Alert,  Well appearing and in no distress. Eyes: Conjunctivae are normal. PERRL. Normal extraocular movements. ENT   Head: Normocephalic and atraumatic.   Nose: No congestion/rhinnorhea.   Mouth/Throat: Mucous membranes are moist.   Neck: No stridor. Cardiovascular: Normal rate, regular rhythm. No murmurs, rubs, or gallops. Respiratory: Normal respiratory effort without tachypnea nor retractions. Breath sounds are clear and equal bilaterally. No wheezes/rales/rhonchi. Gastrointestinal: Soft and nontender. Normal bowel sounds Musculoskeletal: Nontender with normal range of motion in extremities. No lower extremity tenderness nor edema. Neurologic:  Normal speech and language. No gross focal neurologic deficits are appreciated.  Skin:  Skin is warm, dry and intact. No rash noted. Psychiatric: Mood and affect are normal. Speech and behavior are normal.  ___________________________________________  ED COURSE:  Pertinent labs & imaging results that were available during my care of the patient were reviewed by me and considered in my medical decision making (see chart for details). Patient presents for Transient Altered  mental status and possible seizure-like event, we will assess with labs and imaging as indicated.   Procedures ____________________________________________   LABS (pertinent positives/negatives)  Labs Reviewed  COMPREHENSIVE METABOLIC PANEL - Abnormal; Notable for the following:       Result Value   Total Protein 8.2 (*)    All other components within normal limits  URINALYSIS, COMPLETE (UACMP) WITH MICROSCOPIC - Abnormal; Notable for the following:    Color, Urine YELLOW (*)    APPearance CLOUDY (*)    Hgb urine dipstick SMALL (*)    Protein, ur 30 (*)    Leukocytes, UA SMALL (*)    Squamous Epithelial / LPF 0-5 (*)    All other components within normal limits  URINE CULTURE  CBC WITH DIFFERENTIAL/PLATELET  TROPONIN I  CBG MONITORING, ED    RADIOLOGY Images were viewed by me  Chest x-ray, CT head IMPRESSION: 1. No acute intracranial abnormality. 2. Negative noncontrast CT appearance of the brain except for age advanced cerebral volume loss. IMPRESSION: No active disease.  IMPRESSION: Unremarkable transabdominal pelvic ultrasound. ____________________________________________  FINAL ASSESSMENT AND PLAN  Altered mental status, Likely seizure  Plan: Patient's labs and imaging were dictated above. Patient had presented for a likely seizure event. Patient was discussed with neurology who agrees with my assessment. At this point she would not be started on antiepileptics Tressie Stalker referred to neurology for outpatient follow-up. Family also requested an ultrasound due to previous abnormal CT. Findings here were unremarkable. She is stable for outpatient follow-up.   Emily Filbert, MD   Note: This note was generated in part or whole with voice recognition software. Voice recognition is usually quite accurate but there are transcription errors that can and very often do occur. I apologize for any typographical errors that were not detected and corrected.      Emily Filbert, MD 09/26/16 [phone]

## 2016-09-27 DIAGNOSIS — F70 Mild intellectual disabilities: Secondary | ICD-10-CM | POA: Diagnosis not present

## 2016-09-27 LAB — URINE CULTURE: Special Requests: NORMAL

## 2016-09-28 DIAGNOSIS — F70 Mild intellectual disabilities: Secondary | ICD-10-CM | POA: Diagnosis not present

## 2016-10-06 DIAGNOSIS — R569 Unspecified convulsions: Secondary | ICD-10-CM | POA: Diagnosis not present

## 2016-10-06 DIAGNOSIS — Z433 Encounter for attention to colostomy: Secondary | ICD-10-CM | POA: Diagnosis not present

## 2016-10-06 DIAGNOSIS — G3184 Mild cognitive impairment, so stated: Secondary | ICD-10-CM | POA: Diagnosis not present

## 2016-10-10 DIAGNOSIS — F0390 Unspecified dementia without behavioral disturbance: Secondary | ICD-10-CM | POA: Diagnosis not present

## 2016-10-10 DIAGNOSIS — E559 Vitamin D deficiency, unspecified: Secondary | ICD-10-CM | POA: Diagnosis not present

## 2016-10-10 DIAGNOSIS — Q909 Down syndrome, unspecified: Secondary | ICD-10-CM | POA: Diagnosis not present

## 2016-10-10 DIAGNOSIS — E538 Deficiency of other specified B group vitamins: Secondary | ICD-10-CM | POA: Diagnosis not present

## 2016-10-10 DIAGNOSIS — R569 Unspecified convulsions: Secondary | ICD-10-CM | POA: Diagnosis not present

## 2016-10-20 DIAGNOSIS — E7800 Pure hypercholesterolemia, unspecified: Secondary | ICD-10-CM | POA: Diagnosis not present

## 2016-10-20 DIAGNOSIS — E039 Hypothyroidism, unspecified: Secondary | ICD-10-CM | POA: Diagnosis not present

## 2016-10-20 DIAGNOSIS — Z79899 Other long term (current) drug therapy: Secondary | ICD-10-CM | POA: Diagnosis not present

## 2016-10-22 DIAGNOSIS — S82831A Other fracture of upper and lower end of right fibula, initial encounter for closed fracture: Secondary | ICD-10-CM | POA: Diagnosis not present

## 2016-10-22 DIAGNOSIS — S99912A Unspecified injury of left ankle, initial encounter: Secondary | ICD-10-CM | POA: Diagnosis not present

## 2016-10-22 DIAGNOSIS — R569 Unspecified convulsions: Secondary | ICD-10-CM | POA: Diagnosis not present

## 2016-10-22 DIAGNOSIS — M25571 Pain in right ankle and joints of right foot: Secondary | ICD-10-CM | POA: Diagnosis not present

## 2016-10-22 DIAGNOSIS — M25572 Pain in left ankle and joints of left foot: Secondary | ICD-10-CM | POA: Diagnosis not present

## 2016-10-22 DIAGNOSIS — M7989 Other specified soft tissue disorders: Secondary | ICD-10-CM | POA: Diagnosis not present

## 2016-10-27 DIAGNOSIS — Z0000 Encounter for general adult medical examination without abnormal findings: Secondary | ICD-10-CM | POA: Diagnosis not present

## 2016-10-27 DIAGNOSIS — Z933 Colostomy status: Secondary | ICD-10-CM | POA: Diagnosis not present

## 2016-10-27 DIAGNOSIS — S93492A Sprain of other ligament of left ankle, initial encounter: Secondary | ICD-10-CM | POA: Diagnosis not present

## 2016-10-27 DIAGNOSIS — S82891A Other fracture of right lower leg, initial encounter for closed fracture: Secondary | ICD-10-CM | POA: Diagnosis not present

## 2016-11-15 DIAGNOSIS — Z79899 Other long term (current) drug therapy: Secondary | ICD-10-CM | POA: Diagnosis not present

## 2016-11-24 DIAGNOSIS — J0100 Acute maxillary sinusitis, unspecified: Secondary | ICD-10-CM | POA: Diagnosis not present

## 2016-11-29 DIAGNOSIS — M25571 Pain in right ankle and joints of right foot: Secondary | ICD-10-CM | POA: Diagnosis not present

## 2016-11-29 DIAGNOSIS — S82891A Other fracture of right lower leg, initial encounter for closed fracture: Secondary | ICD-10-CM | POA: Diagnosis not present

## 2016-11-29 DIAGNOSIS — S82891D Other fracture of right lower leg, subsequent encounter for closed fracture with routine healing: Secondary | ICD-10-CM | POA: Diagnosis not present

## 2016-12-23 DIAGNOSIS — R569 Unspecified convulsions: Secondary | ICD-10-CM | POA: Diagnosis not present

## 2016-12-23 DIAGNOSIS — F0390 Unspecified dementia without behavioral disturbance: Secondary | ICD-10-CM | POA: Diagnosis not present

## 2016-12-23 DIAGNOSIS — Q909 Down syndrome, unspecified: Secondary | ICD-10-CM | POA: Diagnosis not present

## 2016-12-24 DIAGNOSIS — L0390 Cellulitis, unspecified: Secondary | ICD-10-CM | POA: Diagnosis not present

## 2016-12-26 ENCOUNTER — Inpatient Hospital Stay
Admission: EM | Admit: 2016-12-26 | Discharge: 2016-12-28 | DRG: 603 | Disposition: A | Discharge status: Home / Self Care | Payer: Medicare HMO | Attending: Internal Medicine | Admitting: Internal Medicine

## 2016-12-26 ENCOUNTER — Emergency Department: Payer: Medicare HMO

## 2016-12-26 ENCOUNTER — Encounter: Payer: Self-pay | Admitting: Emergency Medicine

## 2016-12-26 DIAGNOSIS — Z66 Do not resuscitate: Secondary | ICD-10-CM | POA: Diagnosis not present

## 2016-12-26 DIAGNOSIS — K219 Gastro-esophageal reflux disease without esophagitis: Secondary | ICD-10-CM | POA: Diagnosis not present

## 2016-12-26 DIAGNOSIS — F0390 Unspecified dementia without behavioral disturbance: Secondary | ICD-10-CM | POA: Diagnosis present

## 2016-12-26 DIAGNOSIS — R220 Localized swelling, mass and lump, head: Secondary | ICD-10-CM | POA: Diagnosis not present

## 2016-12-26 DIAGNOSIS — Q431 Hirschsprung's disease: Secondary | ICD-10-CM | POA: Diagnosis not present

## 2016-12-26 DIAGNOSIS — F419 Anxiety disorder, unspecified: Secondary | ICD-10-CM | POA: Diagnosis present

## 2016-12-26 DIAGNOSIS — Z880 Allergy status to penicillin: Secondary | ICD-10-CM

## 2016-12-26 DIAGNOSIS — L0201 Cutaneous abscess of face: Secondary | ICD-10-CM | POA: Diagnosis not present

## 2016-12-26 DIAGNOSIS — Q909 Down syndrome, unspecified: Secondary | ICD-10-CM

## 2016-12-26 DIAGNOSIS — E538 Deficiency of other specified B group vitamins: Secondary | ICD-10-CM | POA: Diagnosis present

## 2016-12-26 DIAGNOSIS — E039 Hypothyroidism, unspecified: Secondary | ICD-10-CM | POA: Diagnosis present

## 2016-12-26 DIAGNOSIS — Z933 Colostomy status: Secondary | ICD-10-CM

## 2016-12-26 DIAGNOSIS — L03211 Cellulitis of face: Secondary | ICD-10-CM | POA: Diagnosis not present

## 2016-12-26 HISTORY — DX: Unspecified dementia, unspecified severity, without behavioral disturbance, psychotic disturbance, mood disturbance, and anxiety: F03.90

## 2016-12-26 LAB — CBC
HEMATOCRIT: 41.4 % (ref 35.0–47.0)
Hemoglobin: 14.1 g/dL (ref 12.0–16.0)
MCH: 31.9 pg (ref 26.0–34.0)
MCHC: 34.1 g/dL (ref 32.0–36.0)
MCV: 93.8 fL (ref 80.0–100.0)
Platelets: 395 10*3/uL (ref 150–440)
RBC: 4.41 MIL/uL (ref 3.80–5.20)
RDW: 13.6 % (ref 11.5–14.5)
WBC: 18.4 10*3/uL — AB (ref 3.6–11.0)

## 2016-12-26 LAB — BASIC METABOLIC PANEL
ANION GAP: 11 (ref 5–15)
BUN: 16 mg/dL (ref 6–20)
CO2: 20 mmol/L — AB (ref 22–32)
CREATININE: 0.75 mg/dL (ref 0.44–1.00)
Calcium: 9.3 mg/dL (ref 8.9–10.3)
Chloride: 102 mmol/L (ref 101–111)
Glucose, Bld: 106 mg/dL — ABNORMAL HIGH (ref 65–99)
Potassium: 3.9 mmol/L (ref 3.5–5.1)
SODIUM: 133 mmol/L — AB (ref 135–145)

## 2016-12-26 LAB — LACTIC ACID, PLASMA: LACTIC ACID, VENOUS: 1.4 mmol/L (ref 0.5–1.9)

## 2016-12-26 MED ORDER — SODIUM CHLORIDE 0.9 % IV BOLUS (SEPSIS)
1000.0000 mL | Freq: Once | INTRAVENOUS | Status: AC
  Administered 2016-12-26: 1000 mL via INTRAVENOUS

## 2016-12-26 MED ORDER — FENTANYL CITRATE (PF) 100 MCG/2ML IJ SOLN
50.0000 ug | Freq: Once | INTRAMUSCULAR | Status: AC
  Administered 2016-12-26: 50 ug via INTRAVENOUS
  Filled 2016-12-26: qty 2

## 2016-12-26 MED ORDER — VANCOMYCIN HCL IN DEXTROSE 1-5 GM/200ML-% IV SOLN
1000.0000 mg | Freq: Once | INTRAVENOUS | Status: AC
  Administered 2016-12-26: 1000 mg via INTRAVENOUS
  Filled 2016-12-26: qty 200

## 2016-12-26 MED ORDER — LORAZEPAM 2 MG/ML IJ SOLN
1.0000 mg | Freq: Once | INTRAMUSCULAR | Status: AC
Start: 2016-12-26 — End: 2016-12-26
  Administered 2016-12-26: 1 mg via INTRAVENOUS
  Filled 2016-12-26: qty 1

## 2016-12-26 MED ORDER — IOPAMIDOL (ISOVUE-300) INJECTION 61%
75.0000 mL | Freq: Once | INTRAVENOUS | Status: AC | PRN
  Administered 2016-12-26: 75 mL via INTRAVENOUS

## 2016-12-26 NOTE — ED Triage Notes (Signed)
Pt to triage in wheelchair, accompanied by legal guardian (sister.) Pts sister reports pt came home with red/raised area to the right upper lip on Friday. [facility], sent home with antibiotics, pt seen this AM by PCP and had area lanced and drained. Pt to ED this evening due to area getting larger and area more red.

## 2016-12-26 NOTE — ED Notes (Signed)
Orders per Dr. Don PerkingVeronese

## 2016-12-26 NOTE — ED Provider Notes (Signed)
[facility] Emergency Department Provider Note  ____________________________________________  Time seen: Approximately 10:25 PM  I have reviewed the triage vital signs and the nursing notes.   HISTORY  Chief Complaint Facial Swelling  History is limited due to patient's dementia and Down's syndrome. The majority of the history is elicited from the patient's sister, who is her primary caregiver and with whom she lives.  HPI Lauren Nixon is a 51 y.o. female with a history of Down syndrome and dementia presenting for facial swelling and erythema. 3 days ago, the patient was noted to have erythema just above the right side of the lip with some mild swelling and was placed on doxycycline. She was seen again this morning by her primary care physician in the swelling had worsened so small incision was made just above the lip line and a small amount of pus was extruded. Her sister continued warm compresses and attempted to continue to get pus out, but states it was mostly blood. Over the course of the last 3 days, the patient's swelling and erythema has worsened. She is having significant pain and has had decreased oral intake because of this. There is no known history of dental infection or dental pain. No fevers or chills, nausea or vomiting.   Past Medical History:  Diagnosis Date  . Dementia   . Down's syndrome   . GERD (gastroesophageal reflux disease)   . Hirschsprung disease   . Thyroid disease   . Ventral hernia     Patient Active Problem List   Diagnosis Date Noted  . Pneumonia 04/19/2016  . Pure hypercholesterolemia 03/29/2016  . Aftercare following surgery 03/20/2015  . 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 Disease of thyroid gland 03/19/2015  . Incarcerated ventral hernia 03/15/2015  . Partial bowel obstruction (HCC)   . Ventral hernia with obstruction and without gangrene   . Allergic rhinitis, seasonal 12/23/2014  . Gastro-esophageal reflux  disease without esophagitis 12/23/2014  . 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 COLOSTOMY    . VENTRAL HERNIA REPAIR N/A 03/15/2015   Procedure: VENTRAL HERNIA REPAIR FOR INCARCERATED VENTRAL HERNIA ;  Surgeon: Ida Roguehristopher Lundquist, MD;  Location: [facility] ORS;  Service: General;  Laterality: N/A;    Current Outpatient Rx  . Order #: [id number] Class: Historical Med  . Order #: [id number] Class: Historical Med  . Order #: [id number] Class: Historical Med  . Order #: [id number] Class: Historical Med  . Order #: [id number] Class: Historical Med  . Order #: [id number] Class: Historical Med  . Order #: [id number] Class: Historical Med  . Order #: [id number] Class: Historical Med  . Order #: [id number] Class: Historical Med  . Order #: [id number] Class: Historical Med    Allergies Penicillins  Family History  Problem Relation Age of Onset  . Hypertension Father     Social History Social History  Substance Use Topics  . Smoking status: Never Smoker  . Smokeless tobacco: Never Used  . Alcohol use No    Review of Systems Constitutional: No fever/chills. Eyes: No visual changes. ENT: No sore throat. No congestion or rhinorrhea.Positive facial swelling and erythema. No known dental infection. No difficulty swallowing. Positive pain with eating. Cardiovascular: Denies chest pain. Denies palpitations. Respiratory: Denies shortness of breath.  No cough. Gastrointestinal: No abdominal pain.  No nausea, no vomiting.  No diarrhea.  No constipation. Genitourinary: Negative for dysuria. Musculoskeletal: Negative for back pain. Skin: Negative for rash. Neurological: Negative for headaches. No focal numbness, tingling or weakness.  ____________________________________________   PHYSICAL EXAM:  VITAL SIGNS: ED Triage Vitals [12/26/16 1945]  Enc Vitals Group     BP 112/72     Pulse Rate 90     Resp 16     Temp 98 F (36.7 C)     Temp Source  Oral     SpO2 99 %     Weight 120 lb (54.4 kg)     Height      Head Circumference      Peak Flow      Pain Score      Pain Loc      Pain Edu?      Excl. in GC?     Constitutional: The patient is alert and occasionally answers yes or no questions but is otherwise nonverbal. She appears mildly uncomfortable and scared. She is nontoxic. Eyes: Conjunctivae are normal.  EOMI. No scleral icterus. Head: Atraumatic. Nose: No congestion/rhinnorhea. Mouth/Throat: Mucous membranes are moist. The patient has a large area of erythema and swelling that extends from the right upper lip to the middle of the cheek. She has an incision site from prior I&D this morning just above the lip without any purulent discharge. She has multiple missing teeth but no obvious dental abscess or infection on the buccal mucosa. The posterior pharynx is without erythema, and no tonsillar swelling or exudate. The posterior palate is symmetric and the uvula is midline. There is no hoarse voice or swelling of the tongue or submandibular space.  Neck: No stridor.  Supple.  No JVD.  No meningismus. Cardiovascular: Normal rate, regular rhythm. No murmurs, rubs or gallops.  Respiratory: Normal respiratory effort.  No accessory muscle use or retractions. Lungs CTAB.  No wheezes, rales or ronchi. Gastrointestinal: Overweight. Soft, nontender and nondistended.  No guarding or rebound.  No peritoneal signs. Musculoskeletal: No LE edema.  Neurologic:  Alert.  Speech is clear.    EOMI.  Moves all extremities well. Skin:  Skin is warm, dr. ____________________________________________   [name] (all labs ordered are listed, but only abnormal results are displayed)  Labs Reviewed  CBC - Abnormal; Notable for the following:       Result Value   WBC 18.4 (*)    All other components within normal limits  BASIC METABOLIC PANEL - Abnormal; Notable for the following:    Sodium 133 (*)    CO2 20 (*)    Glucose, Bld 106 (*)    All other  components within normal limits  LACTIC ACID, PLASMA   ____________________________________________  EKG  Not indicated. ____________________________________________  RADIOLOGY  No results found.  ____________________________________________   PROCEDURES  Procedure(s) performed: None  Procedures  Critical Care performed: No ____________________________________________   INITIAL IMPRESSION / ASSESSMENT AND PLAN / ED COURSE  Pertinent labs & imaging results that were available during my care of the patient were reviewed by me and considered in my medical decision making (see chart for details).  51 y.o. female w/ Down's Syndrome and dementia presenting with facial swelling that is progressively worsening despite doxycycline and incision and drainage this morning. Overall, the patient has reassuring vital signs and is afebrile; there is no evidence of systemic illness including bacteremia or meningitis. She does not have any evidence of airway compromise. We'll give CT scan of her face for further evaluation of her infection and treat her with an immediate dose of vancomycin. I'll also treat her for pain. Plan reevaluation for final disposition.  ____________________________________________  FINAL CLINICAL IMPRESSION(S) /  ED DIAGNOSES  Final diagnoses:  Facial cellulitis         NEW MEDICATIONS STARTED DURING THIS VISIT:  New Prescriptions   No medications on file      Rockne Menghini, MD 12/26/16 2234

## 2016-12-27 ENCOUNTER — Encounter: Payer: Self-pay | Admitting: Internal Medicine

## 2016-12-27 DIAGNOSIS — Z880 Allergy status to penicillin: Secondary | ICD-10-CM | POA: Diagnosis not present

## 2016-12-27 DIAGNOSIS — F0390 Unspecified dementia without behavioral disturbance: Secondary | ICD-10-CM | POA: Diagnosis present

## 2016-12-27 DIAGNOSIS — Q909 Down syndrome, unspecified: Secondary | ICD-10-CM | POA: Diagnosis not present

## 2016-12-27 DIAGNOSIS — E538 Deficiency of other specified B group vitamins: Secondary | ICD-10-CM | POA: Diagnosis present

## 2016-12-27 DIAGNOSIS — L03211 Cellulitis of face: Secondary | ICD-10-CM | POA: Diagnosis present

## 2016-12-27 DIAGNOSIS — Z933 Colostomy status: Secondary | ICD-10-CM | POA: Diagnosis not present

## 2016-12-27 DIAGNOSIS — Z66 Do not resuscitate: Secondary | ICD-10-CM | POA: Diagnosis not present

## 2016-12-27 DIAGNOSIS — F419 Anxiety disorder, unspecified: Secondary | ICD-10-CM | POA: Diagnosis present

## 2016-12-27 DIAGNOSIS — Q431 Hirschsprung's disease: Secondary | ICD-10-CM | POA: Diagnosis not present

## 2016-12-27 DIAGNOSIS — K219 Gastro-esophageal reflux disease without esophagitis: Secondary | ICD-10-CM | POA: Diagnosis present

## 2016-12-27 DIAGNOSIS — E039 Hypothyroidism, unspecified: Secondary | ICD-10-CM | POA: Diagnosis present

## 2016-12-27 LAB — CBC
HEMATOCRIT: 40.1 % (ref 35.0–47.0)
Hemoglobin: 13.7 g/dL (ref 12.0–16.0)
MCH: 32 pg (ref 26.0–34.0)
MCHC: 34.2 g/dL (ref 32.0–36.0)
MCV: 93.5 fL (ref 80.0–100.0)
Platelets: 315 10*3/uL (ref 150–440)
RBC: 4.29 MIL/uL (ref 3.80–5.20)
RDW: 13.2 % (ref 11.5–14.5)
WBC: 10.2 10*3/uL (ref 3.6–11.0)

## 2016-12-27 LAB — BASIC METABOLIC PANEL
Anion gap: 7 (ref 5–15)
BUN: 11 mg/dL (ref 6–20)
CALCIUM: 8.7 mg/dL — AB (ref 8.9–10.3)
CO2: 24 mmol/L (ref 22–32)
Chloride: 107 mmol/L (ref 101–111)
Creatinine, Ser: 0.79 mg/dL (ref 0.44–1.00)
GFR calc Af Amer: >60 mL/min (ref >60)
GLUCOSE: 90 mg/dL (ref 65–99)
POTASSIUM: 3.5 mmol/L (ref 3.5–5.1)
SODIUM: 138 mmol/L (ref 135–145)

## 2016-12-27 MED ORDER — FAMOTIDINE 20 MG PO TABS
20.0000 mg | ORAL_TABLET | Freq: Every day | ORAL | Status: DC
  Administered 2016-12-27 – 2016-12-28 (×2): 20 mg via ORAL
  Filled 2016-12-27 (×2): qty 1

## 2016-12-27 MED ORDER — VITAMIN D 1000 UNITS PO TABS
1000.0000 [IU] | ORAL_TABLET | Freq: Every day | ORAL | Status: DC
  Administered 2016-12-27 – 2016-12-28 (×2): 1000 [IU] via ORAL
  Filled 2016-12-27 (×2): qty 1

## 2016-12-27 MED ORDER — GINKGO BILOBA 60 MG PO TABS: 60.0000 mg | ORAL_TABLET | Freq: Every day | ORAL | Status: DC

## 2016-12-27 MED ORDER — ONDANSETRON HCL 4 MG PO TABS: 4.0000 mg | ORAL_TABLET | Freq: Four times a day (QID) | ORAL | Status: DC | PRN

## 2016-12-27 MED ORDER — ADULT MULTIVITAMIN LIQUID CH
15.0000 mL | Freq: Every day | ORAL | Status: DC
  Administered 2016-12-28: 15 mL via ORAL
  Filled 2016-12-27 (×2): qty 15

## 2016-12-27 MED ORDER — PANTOPRAZOLE SODIUM 40 MG PO TBEC
40.0000 mg | DELAYED_RELEASE_TABLET | Freq: Every day | ORAL | Status: DC
Start: 2016-12-27 — End: 2016-12-28
  Administered 2016-12-27 – 2016-12-28 (×2): 40 mg via ORAL
  Filled 2016-12-27 (×2): qty 1

## 2016-12-27 MED ORDER — LEVOTHYROXINE SODIUM 25 MCG PO TABS
37.5000 ug | ORAL_TABLET | Freq: Every day | ORAL | Status: DC
  Administered 2016-12-27 – 2016-12-28 (×2): 37.5 ug via ORAL
  Filled 2016-12-27 (×2): qty 2

## 2016-12-27 MED ORDER — MELATONIN 3 MG PO TABS: 3.0000 mg | ORAL_TABLET | Freq: Every day | ORAL | Status: DC

## 2016-12-27 MED ORDER — ADULT MULTIVITAMIN W/MINERALS CH
1.0000 | ORAL_TABLET | Freq: Every day | ORAL | Status: DC
  Administered 2016-12-27: 1 via ORAL
  Filled 2016-12-27: qty 1

## 2016-12-27 MED ORDER — ACETAMINOPHEN 325 MG PO TABS: 650.0000 mg | ORAL_TABLET | Freq: Four times a day (QID) | ORAL | Status: DC | PRN

## 2016-12-27 MED ORDER — VANCOMYCIN HCL IN DEXTROSE 1-5 GM/200ML-% IV SOLN: 1000.0000 mg | Freq: Once | INTRAVENOUS | Status: DC

## 2016-12-27 MED ORDER — SENNOSIDES-DOCUSATE SODIUM 8.6-50 MG PO TABS: 1.0000 | ORAL_TABLET | Freq: Every evening | ORAL | Status: DC | PRN

## 2016-12-27 MED ORDER — MULTIVITAMIN GUMMIES ADULT PO CHEW: 1.0000 | CHEWABLE_TABLET | Freq: Every day | ORAL | Status: DC

## 2016-12-27 MED ORDER — ONDANSETRON HCL 4 MG/2ML IJ SOLN: 4.0000 mg | Freq: Four times a day (QID) | INTRAMUSCULAR | Status: DC | PRN

## 2016-12-27 MED ORDER — LAMOTRIGINE 100 MG PO TABS
100.0000 mg | ORAL_TABLET | Freq: Two times a day (BID) | ORAL | Status: DC
  Administered 2016-12-27 – 2016-12-28 (×3): 100 mg via ORAL
  Filled 2016-12-27 (×2): qty 1
  Filled 2016-12-27: qty 4
  Filled 2016-12-27 (×2): qty 1
  Filled 2016-12-27: qty 4
  Filled 2016-12-27: qty 1
  Filled 2016-12-27: qty 4

## 2016-12-27 MED ORDER — POTASSIUM CHLORIDE CRYS ER 20 MEQ PO TBCR
40.0000 meq | EXTENDED_RELEASE_TABLET | Freq: Once | ORAL | Status: AC
  Administered 2016-12-27: 40 meq via ORAL
  Filled 2016-12-27: qty 2

## 2016-12-27 MED ORDER — MELATONIN 5 MG PO TABS
2.5000 mg | ORAL_TABLET | Freq: Every day | ORAL | Status: DC
  Administered 2016-12-27: 2.5 mg via ORAL
  Filled 2016-12-27 (×2): qty 0.5

## 2016-12-27 MED ORDER — LORATADINE 10 MG PO TABS
10.0000 mg | ORAL_TABLET | Freq: Every day | ORAL | Status: DC
  Administered 2016-12-27 – 2016-12-28 (×2): 10 mg via ORAL
  Filled 2016-12-27 (×2): qty 1

## 2016-12-27 MED ORDER — ENOXAPARIN SODIUM 40 MG/0.4ML ~~LOC~~ SOLN: 40.0000 mg | SUBCUTANEOUS | Status: DC

## 2016-12-27 MED ORDER — ACETAMINOPHEN 650 MG RE SUPP: 650.0000 mg | Freq: Four times a day (QID) | RECTAL | Status: DC | PRN

## 2016-12-27 MED ORDER — VITAMIN B-12 1000 MCG PO TABS
1000.0000 ug | ORAL_TABLET | Freq: Every day | ORAL | Status: DC
  Administered 2016-12-27 – 2016-12-28 (×2): 1000 ug via ORAL
  Filled 2016-12-27 (×2): qty 1

## 2016-12-27 MED ORDER — VANCOMYCIN HCL IN DEXTROSE 750-5 MG/150ML-% IV SOLN
750.0000 mg | INTRAVENOUS | Status: DC
  Administered 2016-12-27 – 2016-12-28 (×2): 750 mg via INTRAVENOUS
  Filled 2016-12-27 (×2): qty 150

## 2016-12-27 MED ORDER — ASPIRIN EC 81 MG PO TBEC
81.0000 mg | DELAYED_RELEASE_TABLET | Freq: Every day | ORAL | Status: DC
  Administered 2016-12-27: 81 mg via ORAL
  Filled 2016-12-27 (×3): qty 1

## 2016-12-27 MED ORDER — FLUTICASONE PROPIONATE 50 MCG/ACT NA SUSP
1.0000 | Freq: Two times a day (BID) | NASAL | Status: DC
  Administered 2016-12-27 – 2016-12-28 (×3): 1 via NASAL
  Filled 2016-12-27: qty 16

## 2016-12-27 MED ORDER — ALPRAZOLAM 0.25 MG PO TABS
0.2500 mg | ORAL_TABLET | Freq: Three times a day (TID) | ORAL | Status: DC | PRN
  Administered 2016-12-27: 0.25 mg via ORAL
  Filled 2016-12-27: qty 1

## 2016-12-27 NOTE — ED Notes (Addendum)
Pt tearful at times. sts that she is not in any pain. Family sts that she has been difficult to console. Admitting MD paged

## 2016-12-27 NOTE — H&P (Signed)
[facility] -  at [facility]   PATIENT NAME: Lauren Nixon    MR#:  [id number]  DATE OF BIRTH:  October 14, 1965  DATE OF ADMISSION:  12/26/2016  PRIMARY CARE PHYSICIAN: Kandyce RudBabaoff, Marcus, MD   REQUESTING/REFERRING PHYSICIAN:   CHIEF COMPLAINT:   Chief Complaint  Patient presents with  . Facial Swelling    HISTORY OF PRESENT ILLNESS: Lauren Nixon  is a 51 y.o. female with a known history of Down syndrome, GERD, dementia, hirschsprung disease, ventral hernia presented to the emergency room with redness and swelling of the right side of the face around the upper lip. Pain is also present at the indurated area around the right side of the lip. This started around Friday as a small pimple and increased in size. Patient went to primary care physician office and started on oral doxycycline antibiotic and she also had incision and drainage yesterday. But the swelling increased in size and hence came to the emergency room. Patient also feels anxious and restless. Accompanied by sister takes care of the patient and Power of attorney. Not much history could be obtained from the patient.  PAST MEDICAL HISTORY:   Past Medical History:  Diagnosis Date  . Dementia   . Down's syndrome   . GERD (gastroesophageal reflux disease)   . Hirschsprung disease   . Thyroid disease   . Ventral hernia     PAST SURGICAL HISTORY: Past Surgical History:  Procedure Laterality Date  . COLOSTOMY    . VENTRAL HERNIA REPAIR N/A 03/15/2015   Procedure: VENTRAL HERNIA REPAIR FOR INCARCERATED VENTRAL HERNIA ;  Surgeon: Ida Roguehristopher Lundquist, MD;  Location: [facility] ORS;  Service: General;  Laterality: N/A;    SOCIAL HISTORY:  Social History  Substance Use Topics  . Smoking status: Never Smoker  . Smokeless tobacco: Never Used  . Alcohol use No    FAMILY HISTORY:  Family History  Problem Relation Age of Onset  . Hypertension Father     DRUG ALLERGIES:  Allergies  Allergen  Reactions  . Penicillins Other (See Comments)    Childhood allergy- sister does not know details  .Has patient had a PCN reaction causing immediate rash, facial/tongue/throat swelling, SOB or lightheadedness with hypotension: Unknown Has patient had a PCN reaction causing severe rash involving mucus membranes or skin necrosis: Unknown Has patient had a PCN reaction that required hospitalization: Unknown Has patient had a PCN reaction occurring within the last 10 years: Unknown If all of the above answers are "NO", then may proceed with Cephalosp    REVIEW OF SYSTEMS:  Could not be obtained, developmental retardation noted  MEDICATIONS AT HOME:  Prior to Admission medications   Medication Sig Start Date End Date Taking? Authorizing Provider  acetaminophen (TYLENOL) 500 MG tablet Take 500 mg by mouth 2 (two) times daily.   Yes [provider]  aspirin EC 81 MG tablet Take 81 mg by mouth daily.   Yes [provider]  Cholecalciferol (VITAMIN D3) 1000 units CAPS Take 1,000 Units by mouth daily.   Yes [provider]  doxycycline (VIBRA-TABS) 100 MG tablet Take 100 mg by mouth 2 (two) times daily. 12/24/16 01/03/17 Yes [provider]  esomeprazole (NEXIUM) 40 MG capsule Take 1 capsule by mouth daily.    Yes [provider]  fluticasone (FLONASE) 50 MCG/ACT nasal spray Place 1 spray into both nostrils 2 (two) times daily.    Yes [provider]  Ginkgo Biloba 60 MG TABS Take 60 mg  by mouth daily.   Yes [provider]  lamoTRIgine (LAMICTAL) 100 MG tablet Take 100 mg by mouth 2 (two) times daily. 11/23/16  Yes [provider]  levothyroxine (SYNTHROID, LEVOTHROID) 75 MCG tablet Take 0.5 tablets by mouth daily.   Yes [provider]  Melatonin 3 MG TABS Take 3 mg by mouth at bedtime.    Yes [provider]  Multiple Vitamins-Minerals (MULTIVITAMIN GUMMIES ADULT) CHEW Chew 1 tablet by mouth daily.   Yes [provider]  ranitidine (ZANTAC) 300 MG tablet Take 300 mg by mouth every evening.   Yes [provider]  vitamin B-12 (CYANOCOBALAMIN) 1000 MCG tablet Take 1,000 mcg by mouth daily.   Yes [provider]  cetirizine (ZYRTEC) 10 MG tablet Take 10 mg by mouth daily.    [provider]      PHYSICAL EXAMINATION:   VITAL SIGNS: Blood pressure 107/80, pulse 89, temperature 98 F (36.7 C), temperature source Oral, resp. rate 16, weight 54.4 kg (120 lb), SpO2 95 %.  GENERAL:  51 y.o.-year-old patient lying in the bed with no acute distress.  EYES: Pupils equal, round, reactive to light and accommodation. No scleral icterus. Extraocular muscles intact.  HEENT: Head atraumatic, normocephalic. Oropharynx and nasopharynx clear.  Redness , induration swelling around upper lip and right cheek area. NECK:  Supple, no jugular venous distention. No thyroid enlargement, no tenderness.  LUNGS: Normal breath sounds bilaterally, no wheezing, rales,rhonchi or crepitation. No use of accessory muscles of respiration.  CARDIOVASCULAR: S1, S2 normal. No murmurs, rubs, or gallops.  ABDOMEN: Soft, nontender, nondistended. Bowel sounds present. No organomegaly or mass.  Colostomy noted. EXTREMITIES: No pedal edema, cyanosis, or clubbing.  NEUROLOGIC: Cranial nerves II through XII are intact. Muscle strength 5/5 in all extremities. Sensation intact. Gait not checked.  PSYCHIATRIC: The patient is alert and oriented x 3.  SKIN: redness, induration swelling of right cheek and upper lip area.  LABORATORY PANEL:   CBC  Recent Labs Lab 12/26/16 1959  WBC 18.4*  HGB 14.1  HCT 41.4  PLT 395  MCV 93.8  MCH 31.9  MCHC 34.1  RDW 13.6   ------------------------------------------------------------------------------------------------------------------  Chemistries   Recent Labs Lab 12/26/16 1959  NA 133*  K 3.9  CL 102  CO2 20*  GLUCOSE 106*  BUN 16  CREATININE 0.75   CALCIUM 9.3   ------------------------------------------------------------------------------------------------------------------ estimated creatinine clearance is 57.1 mL/min (by C-G formula based on SCr of 0.75 mg/dL). ------------------------------------------------------------------------------------------------------------------ No results for input(s): TSH, T4TOTAL, T3FREE, THYROIDAB in the last 72 hours.  Invalid input(s): FREET3   Coagulation profile No results for input(s): INR, PROTIME in the last 168 hours. ------------------------------------------------------------------------------------------------------------------- No results for input(s): DDIMER in the last 72 hours. -------------------------------------------------------------------------------------------------------------------  Cardiac Enzymes No results for input(s): CKMB, TROPONINI, MYOGLOBIN in the last 168 hours.  Invalid input(s): CK ------------------------------------------------------------------------------------------------------------------ Invalid input(s): POCBNP  ---------------------------------------------------------------------------------------------------------------  Urinalysis    Component Value Date/Time   COLORURINE YELLOW (A) 09/26/2016 0722   APPEARANCEUR CLOUDY (A) 09/26/2016 0722   LABSPEC 1.017 09/26/2016 0722   PHURINE 5.0 09/26/2016 0722   GLUCOSEU NEGATIVE 09/26/2016 0722   HGBUR SMALL (A) 09/26/2016 0722   BILIRUBINUR NEGATIVE 09/26/2016 0722   KETONESUR NEGATIVE 09/26/2016 0722   PROTEINUR 30 (A) 09/26/2016 0722   NITRITE NEGATIVE 09/26/2016 0722   LEUKOCYTESUR SMALL (A) 09/26/2016 0722     RADIOLOGY: Ct Maxillofacial W Contrast  Result Date: 12/27/2016 CLINICAL DATA:  Initial evaluation for right lip swelling, evaluate for abscess. The  EXAM: CT MAXILLOFACIAL WITH CONTRAST TECHNIQUE: Multidetector CT imaging of the maxillofacial structures was performed. Multiplanar  CT image reconstructions were also generated. A small metallic BB was placed on the right temple in order to reliably differentiate right from left. COMPARISON:  None. FINDINGS: Osseous: Study is severely degraded by motion artifact. No definite acute osseous abnormality within the face. Orbits: Globes and orbital soft tissues within normal limits. Sinuses: Chronic opacification within the maxillary sinuses bilaterally, right greater than left. Remainder of the paranasal sinuses are otherwise largely clear. Mastoid air cells and middle ear cavities are clear. Soft tissues: Evaluation of the facial soft tissues severely limited by motion, particularly at the area of interest at the lips/ lower face. There is asymmetric soft tissue swelling with edema and inflammatory stranding at the right upper lip, extending laterally towards the right face/ masticator space, concerning for cellulitis. No definite discrete abscess identified on this motion degraded exam. Retro antral and parapharyngeal fat is preserved without evidence for root deeper extension of infection into the face. The end parotid glands are within normal limits. Submandibular glands grossly normal, although evaluation limited. No obvious adenopathy within the neck on this motion degraded exam. Evaluation the oral cavity fairly limited. Limited intracranial: Unremarkable. IMPRESSION: 1. Severely limited study due to extensive motion artifact. 2. Asymmetric swelling with edema and inflammatory stranding at the right upper lip and right face/masticator space, concerning for cellulitis. No definite abscess or drainable fluid collection identified on this motion degraded exam. If there is persistent high clinical suspicion for an underlying occult abscess, re- imaging when the patient is able to tolerate the exam is recommended. 3. Chronic maxillary sinusitis, right greater than left. Electronically Signed   By: Rise Mu M.D.   On: 12/27/2016 00:54     EKG: Orders placed or performed during the hospital encounter of 04/19/16  . ED EKG  . ED EKG  . EKG 12-Lead  . EKG 12-Lead    IMPRESSION AND PLAN: 51 year old female patient with history of Down syndrome, dementia, GERD,hirschsprung disease presented to the emergency room with facial swelling. Admitting diagnosis 1. Right facial cellulitis 2. Facial pain 3. Down syndrome 4. Dementia Treatment plan Admit patient to medical floor Start patient on IV vancomycin antibiotic Oral Xanax for anxiety as needed Follow-up WBC count DVT prophylaxis subcutaneous Lovenox 40 MG daily  All the records are reviewed and case discussed with ED provider. Management plans discussed with the patient, family and they are in agreement.  CODE STATUS:FULL CODE Code Status History    Date Active Date Inactive Code Status Order ID Comments User Context   04/19/2016  5:01 PM 04/20/2016  8:28 PM Full Code [id number]  Shaune Pollack, MD Inpatient   03/15/2015  6:00 AM 03/16/2015  8:07 PM Full Code [id number]  Ida Rogue, MD Inpatient       TOTAL TIME TAKING CARE OF THIS PATIENT: 50 minutes.    Ihor Austin M.D on 12/27/2016 at 2:49 AM  Between 7am to 6pm - Pager - [phone]  After 6pm go to [email or web address] - password [email or web address] [facility]  [facility]  Office  [phone]  CC: Primary care physician; Kandyce Rud, MD

## 2016-12-27 NOTE — Progress Notes (Signed)
Patient ID: Humberto SealsMelanie B Nixon, female   DOB: 1966/02/07, 51 y.o.   MRN: [id number]  ACP time  Patient and patient's brother at the bedside  Diagnosis: Down syndrome with dementia. Brother states that the patient has been declining over the past year and is not able to do the things that she used to do in the past.  I discussed CODE STATUS and what happens if the patient is full code in the CPR process. The patient's brother one at this week with the rest of the family and get back with me.  The patient's sister and POA got back with me about changing the patient's CODE STATUS to DO NOT RESUSCITATE.  Time spent on ACP discussion 17 minutes  Dr. Alford Highlandichard Tracie Lindbloom

## 2016-12-27 NOTE — ED Notes (Signed)
Admitting MD at bedside.

## 2016-12-27 NOTE — Progress Notes (Signed)
Patient ID: Lauren Nixon, female   DOB: [birth year], 51 y.o.   MRN: [id number]  [facility] [employer] PROGRESS NOTE  Lauren SealsMelanie B Nixon YQM:[id number] DOB: [birth year] DOA: 12/26/2016 PCP: Kandyce RudBabaoff, Marcus, MD  HPI/Subjective: Patient with history of Down syndrome and dementia is not the best historian. She is eating a girl cheese sandwich for lunch when I saw her. She did have some pain in her lip. She was seen 3 times as outpatient and started on oral doxycycline and one time had her lip lanced. She was referred over to the ER for admission  Objective: Vitals:   12/27/16 0311 12/27/16 0732  BP: 100/70 94/69  Pulse: 87 89  Resp: 16   Temp: 98 F (36.7 C) 98.7 F (37.1 C)    Filed Weights   12/26/16 1945 12/27/16 0311  Weight: 54.4 kg (120 lb) 50.8 kg (112 lb)    ROS: Review of Systems  Unable to perform ROS: Dementia   Exam: Physical Exam  HENT:  Nose: No mucosal edema.  Mouth/Throat: No oropharyngeal exudate or posterior oropharyngeal edema.  Eyes: Conjunctivae and lids are normal. Pupils are equal, round, and reactive to light.  Neck: No JVD present. Carotid bruit is not present. No edema present. No thyroid mass and no thyromegaly present.  Cardiovascular: S1 normal and S2 normal.  Exam reveals no gallop.   No murmur heard. Pulses:      Dorsalis pedis pulses are 2+ on the right side, and 2+ on the left side.  Respiratory: No respiratory distress. She has no wheezes. She has no rhonchi. She has no rales.  GI: Soft. Bowel sounds are normal. There is no tenderness.  Musculoskeletal:       Right ankle: She exhibits no swelling.       Left ankle: She exhibits no swelling.  Lymphadenopathy:    She has no cervical adenopathy.  Neurological: She is alert.  Patient able to move her extremities on her own  Skin: Skin is warm. Nails show no clubbing.  Right upper lip swollen and erythematous.  Psychiatric:  Communicates better with her family then with me. Patient was crying when I  was examining her.      Data Reviewed: Basic Metabolic Panel:  Recent Labs Lab 12/26/16 1959 12/27/16 0354  NA 133* 138  K 3.9 3.5  CL 102 107  CO2 20* 24  GLUCOSE 106* 90  BUN 16 11  CREATININE 0.75 0.79  CALCIUM 9.3 8.7*   CBC:  Recent Labs Lab 12/26/16 1959 12/27/16 0354  WBC 18.4* 10.2  HGB 14.1 13.7  HCT 41.4 40.1  MCV 93.8 93.5  PLT 395 315    Studies: Ct Maxillofacial W Contrast  Result Date: 12/27/2016 CLINICAL DATA:  Initial evaluation for right lip swelling, evaluate for abscess. The EXAM: CT MAXILLOFACIAL WITH CONTRAST TECHNIQUE: Multidetector CT imaging of the maxillofacial structures was performed. Multiplanar CT image reconstructions were also generated. A small metallic BB was placed on the right temple in order to reliably differentiate right from left. COMPARISON:  None. FINDINGS: Osseous: Study is severely degraded by motion artifact. No definite acute osseous abnormality within the face. Orbits: Globes and orbital soft tissues within normal limits. Sinuses: Chronic opacification within the maxillary sinuses bilaterally, right greater than left. Remainder of the paranasal sinuses are otherwise largely clear. Mastoid air cells and middle ear cavities are clear. Soft tissues: Evaluation of the facial soft tissues severely limited by motion, particularly at the area of interest at the lips/ lower face.  There is asymmetric soft tissue swelling with edema and inflammatory stranding at the right upper lip, extending laterally towards the right face/ masticator space, concerning for cellulitis. No definite discrete abscess identified on this motion degraded exam. Retro antral and parapharyngeal fat is preserved without evidence for root deeper extension of infection into the face. The end parotid glands are within normal limits. Submandibular glands grossly normal, although evaluation limited. No obvious adenopathy within the neck on this motion degraded exam.  Evaluation the oral cavity fairly limited. Limited intracranial: Unremarkable. IMPRESSION: 1. Severely limited study due to extensive motion artifact. 2. Asymmetric swelling with edema and inflammatory stranding at the right upper lip and right face/masticator space, concerning for cellulitis. No definite abscess or drainable fluid collection identified on this motion degraded exam. If there is persistent high clinical suspicion for an underlying occult abscess, re- imaging when the patient is able to tolerate the exam is recommended. 3. Chronic maxillary sinusitis, right greater than left. Electronically Signed   By: Rise Mu M.D.   On: 12/27/2016 00:54    Scheduled Meds: . aspirin EC  81 mg Oral Daily  . cholecalciferol  1,000 Units Oral Daily  . enoxaparin (LOVENOX) injection  40 mg Subcutaneous Q24H  . famotidine  20 mg Oral Daily  . fluticasone  1 spray Each Nare BID  . lamoTRIgine  100 mg Oral BID  . levothyroxine  37.5 mcg Oral QAC breakfast  . loratadine  10 mg Oral Daily  . Melatonin  2.5 mg Oral QHS  . multivitamin  15 mL Oral Daily  . pantoprazole  40 mg Oral Daily  . vitamin B-12  1,000 mcg Oral Daily   Continuous Infusions: . vancomycin 750 mg (12/27/16 1441)    Assessment/Plan:  1. Facial cellulitis. Patient placed on IV vancomycin. Monitor daily for improvement. 2. Hypothyroidism unspecified on levothyroxine 3. History of Down syndrome and dementia 4. GERD on Protonix and famotidine 5. B12 deficiency on B12 supplementation  Code Status:     Code Status Orders        Start     Ordered   12/27/16 0306  Full code  Continuous     12/27/16 0305    Code Status History    Date Active Date Inactive Code Status Order ID Comments User Context   04/19/2016  5:01 PM 04/20/2016  8:28 PM Full Code [id number]  Shaune Pollack, MD Inpatient   03/15/2015  6:00 AM 03/16/2015  8:07 PM Full Code [id number]  Ida Rogue, MD Inpatient    Advance Directive  Documentation     Most Recent Value  Type of Advance Directive  Healthcare Power of Attorney, Living will  Pre-existing out of facility DNR order (yellow form or pink MOST form)  -- [POA will bring DNR forms to hospital]  "MOST" Form in Place?  -     Family Communication: spoke with POA on the phone and the POA's brother at the bedside Disposition Plan: evaluate on a day-to-day basis on when to go home  Antibiotics:  vancomycin  Time spent: 35 minutes including ACP time  Alford Highland  [employer]

## 2016-12-27 NOTE — Progress Notes (Signed)
Initial Nutrition Assessment  DOCUMENTATION CODES:   Obesity unspecified  INTERVENTION:   Premier Protein BID, each supplement provides 160kcal and 30g protein.   MVI  NUTRITION DIAGNOSIS:   Increased nutrient needs related to wound healing as evidenced by increased estimated needs from protein.  GOAL:   Patient will meet greater than or equal to 90% of their needs  MONITOR:   PO intake, Supplement acceptance, Labs, Weight trends  REASON FOR ASSESSMENT:   Malnutrition Screening Tool    ASSESSMENT:   51 y.o. female with a known history of Down syndrome, GERD, dementia, hirschsprung disease, ventral hernia presented to the emergency room with redness and swelling of the right side of the face around the upper lip.   Met with pt and family in room today. Unable to obtain nutrition related history from pt so history was obtained from family members. Family reports that pt is usually a good eater but that over the past few months, her appetite has continued to decrease. Pt is still eating well but will only eat 3/4 of her food when she used to finish all of it. Family reports some weight loss; per chart, pt has lost 8lbs(7%) in 3 months which is not significant. RD recommended to family that they find a low calorie, high protein supplement to provide to pt. RD will order Premier Protein to help pt meet estimated needs. Family requesting liquid MVI instead of pill; RD will switch these.    Medications reviewed and include: aspirin, Vit D, lovenox, pepcid, synthroid, melatonin, MVI, protonix, Vit B-12, vancomycin  Labs reviewed: Ca 8.7(L)  Nutrition-Focused physical exam completed. Findings are no fat depletion, no muscle depletion, and no edema.   Diet Order:  Diet regular Room service appropriate? Yes; Fluid consistency: Thin  Skin:  Wound (see comment) (incision lip)  Last BM:  7/10  Height:   Ht Readings from Last 1 Encounters:  12/27/16 4' 5"  (1.346 m)    Weight:    Wt Readings from Last 1 Encounters:  12/27/16 112 lb (50.8 kg)    Ideal Body Weight:  40.1 kg  BMI:  Body mass index is 28.03 kg/m.  Estimated Nutritional Needs:   Kcal:  1200-1400kcal/day   Protein:  56-66g/day   Fluid:  >1.2L/day   EDUCATION NEEDS:   Education needs no appropriate at this time  Koleen Distance MS, RD, [place] Pager #[phone] After Hours Pager: [phone]

## 2016-12-27 NOTE — Progress Notes (Signed)
Pnt admission complete. POA pnt sister at bedside. No issues or concerns. Pnt resting and denied pain this shift. Bed low  And locked, call bell in reach.

## 2016-12-27 NOTE — Progress Notes (Signed)
PHARMACIST - PHYSICIAN ORDER COMMUNICATION  CONCERNING: P&T Medication Policy on Herbal Medications  DESCRIPTION:  This patient's order for:  Ginkgo biloba  has been noted.  This product(s) is classified as an "herbal" or natural product. Due to a lack of definitive safety studies or FDA approval, nonstandard manufacturing practices, plus the potential risk of unknown drug-drug interactions while on inpatient medications, the Pharmacy and Therapeutics Committee does not permit the use of "herbal" or natural products of this type within [facility].   ACTION TAKEN: The pharmacy department is unable to verify this order at this time. Please reevaluate patient's clinical condition at discharge and address if the herbal or natural product(s) should be resumed at that time.

## 2016-12-27 NOTE — Progress Notes (Signed)
Pharmacy Antibiotic Note  Lauren Nixon is a 51 y.o. female admitted on 12/26/2016 with cellulitis.  Pharmacy has been consulted for vancomycin dosing.  Plan: DW 43kg  Vd 30L kei 0.052 hr-1  T1/2 13 hours Vancomycin 750 mg q 18 hours ordered with stacked dosing. Level before 5th dose. Goal trough 15-20 since could not r/o abscess on CT.   Weight: 120 lb (54.4 kg)  Temp (24hrs), Avg:98 F (36.7 C), Min:98 F (36.7 C), Max:98 F (36.7 C)   Recent Labs Lab 12/26/16 1959  WBC 18.4*  CREATININE 0.75  LATICACIDVEN 1.4    Estimated Creatinine Clearance: 57.1 mL/min (by C-G formula based on SCr of 0.75 mg/dL).    Allergies  Allergen Reactions  . Penicillins Other (See Comments)    Childhood allergy- sister does not know details  .Has patient had a PCN reaction causing immediate rash, facial/tongue/throat swelling, SOB or lightheadedness with hypotension: Unknown Has patient had a PCN reaction causing severe rash involving mucus membranes or skin necrosis: Unknown Has patient had a PCN reaction that required hospitalization: Unknown Has patient had a PCN reaction occurring within the last 10 years: Unknown If all of the above answers are "NO", then may proceed with Cephalosp    Antimicrobials this admission: vancomycin 7/09 >>    >>   Dose adjustments this admission:   Microbiology results: No micro   Thank you for allowing pharmacy to be a part of this patient's care.  Jandy Brackens S 12/27/2016 3:01 AM

## 2016-12-27 NOTE — ED Provider Notes (Signed)
-----------------------------------------   1:23 AM on 12/27/2016 -----------------------------------------  CT maxillofacial interpreted per Dr. Phill MyronMcClintock: 1. Severely limited study due to extensive motion artifact.  2. Asymmetric swelling with edema and inflammatory stranding at the  right upper lip and right face/masticator space, concerning for  cellulitis. No definite abscess or drainable fluid collection  identified on this motion degraded exam. If there is persistent high  clinical suspicion for an underlying occult abscess, re- imaging  when the patient is able to tolerate the exam is recommended.  3. Chronic maxillary sinusitis, right greater than left.   Updated patient and family member of CT imaging results. Will discuss with hospitalist evaluate patient in the emergency department for admission.   Irean HongSung, Jade J, MD 12/27/16 [phone]

## 2016-12-27 NOTE — Progress Notes (Signed)
Family requesting vital signs to be done early so pt may get some rest tonight.

## 2016-12-28 LAB — HIV ANTIBODY (ROUTINE TESTING W REFLEX): HIV Screen 4th Generation wRfx: NONREACTIVE

## 2016-12-28 MED ORDER — SULFAMETHOXAZOLE-TRIMETHOPRIM 800-160 MG PO TABS: 1.0000 | ORAL_TABLET | Freq: Two times a day (BID) | ORAL | 0 refills | Status: AC

## 2016-12-28 MED ORDER — SULFAMETHOXAZOLE-TRIMETHOPRIM 800-160 MG PO TABS: 1.0000 | ORAL_TABLET | Freq: Two times a day (BID) | ORAL | Status: DC

## 2016-12-28 NOTE — Discharge Summary (Signed)
[facility] [place] [facility]   PATIENT NAME: Lauren Nixon    MR#:  [id number]  DATE OF BIRTH:  04/06/1966  DATE OF ADMISSION:  12/26/2016 ADMITTING PHYSICIAN: Ihor Austin, MD  DATE OF DISCHARGE: 12/28/2016  PRIMARY CARE PHYSICIAN: Kandyce Rud, MD    ADMISSION DIAGNOSIS:  Facial cellulitis [L03.211]  DISCHARGE DIAGNOSIS:  Active Problems:   Facial cellulitis   SECONDARY DIAGNOSIS:   Past Medical History:  Diagnosis Date  . Dementia   . Down's syndrome   . GERD (gastroesophageal reflux disease)   . Hirschsprung disease   . Thyroid disease   . Ventral hernia     HOSPITAL COURSE:   1. Facial cellulitis. Patient was started on IV vancomycin. Area of erythema and swelling has improved since yesterday. Looks like it is starting to dry up. There is still small area of fluctuance on the right upper lip but better than yesterday. Family would rather take her home and do as outpatient. Switch over to by mouth Bactrim upon going home. 2. Hypothyroidism unspecified on levothyroxine 3. History of Down syndrome and dementia 4 GERD on Protonix and famotidine 5 B12 deficiency on B12 supplementation  DISCHARGE CONDITIONS:   Satisfactory  CONSULTS OBTAINED:   none  DRUG ALLERGIES:   Allergies  Allergen Reactions  . Penicillins Other (See Comments)    Childhood allergy- sister does not know details  .Has patient had a PCN reaction causing immediate rash, facial/tongue/throat swelling, SOB or lightheadedness with hypotension: Unknown Has patient had a PCN reaction causing severe rash involving mucus membranes or skin necrosis: Unknown Has patient had a PCN reaction that required hospitalization: Unknown Has patient had a PCN reaction occurring within the last 10 years: Unknown If all of the above answers are "NO", then may proceed with Cephalosp    DISCHARGE MEDICATIONS:   Current Discharge Medication List    START taking these medications    Details  sulfamethoxazole-trimethoprim (BACTRIM DS,SEPTRA DS) 800-160 MG tablet Take 1 tablet by mouth every 12 (twelve) hours. Qty: 18 tablet, Refills: 0      CONTINUE these medications which have NOT CHANGED   Details  acetaminophen (TYLENOL) 500 MG tablet Take 500 mg by mouth 2 (two) times daily.    aspirin EC 81 MG tablet Take 81 mg by mouth daily.    Cholecalciferol (VITAMIN D3) 1000 units CAPS Take 1,000 Units by mouth daily.    esomeprazole (NEXIUM) 40 MG capsule Take 1 capsule by mouth daily.     fluticasone (FLONASE) 50 MCG/ACT nasal spray Place 1 spray into both nostrils 2 (two) times daily.     Ginkgo Biloba 60 MG TABS Take 60 mg by mouth daily.    lamoTRIgine (LAMICTAL) 100 MG tablet Take 100 mg by mouth 2 (two) times daily.    levothyroxine (SYNTHROID, LEVOTHROID) 75 MCG tablet Take 0.5 tablets by mouth daily.    Melatonin 3 MG TABS Take 3 mg by mouth at bedtime.     Multiple Vitamins-Minerals (MULTIVITAMIN GUMMIES ADULT) CHEW Chew 1 tablet by mouth daily.    ranitidine (ZANTAC) 300 MG tablet Take 300 mg by mouth every evening.    vitamin B-12 (CYANOCOBALAMIN) 1000 MCG tablet Take 1,000 mcg by mouth daily.    cetirizine (ZYRTEC) 10 MG tablet Take 10 mg by mouth daily.      STOP taking these medications     doxycycline (VIBRA-TABS) 100 MG tablet          DISCHARGE INSTRUCTIONS:   Follow-up  PMD one week  If you experience worsening of your admission symptoms, develop shortness of breath, life threatening emergency, suicidal or homicidal thoughts you must seek medical attention immediately by calling 911 or calling your MD immediately  if symptoms less severe.  You Must read complete instructions/literature along with all the possible adverse reactions/side effects for all the Medicines you take and that have been prescribed to you. Take any new Medicines after you have completely understood and accept all the possible adverse reactions/side effects.    Please note  You were cared for by a hospitalist during your hospital stay. If you have any questions about your discharge medications or the care you received while you were in the hospital after you are discharged, you can call the unit and asked to speak with the hospitalist on call if the hospitalist that took care of you is not available. Once you are discharged, your primary care physician will handle any further medical issues. Please note that NO REFILLS for any discharge medications will be authorized once you are discharged, as it is imperative that you return to your primary care physician (or establish a relationship with a primary care physician if you do not have one) for your aftercare needs so that they can reassess your need for medications and monitor your lab values.    Today   CHIEF COMPLAINT:   Chief Complaint  Patient presents with  . Facial Swelling    HISTORY OF PRESENT ILLNESS:  Lauren Nixon  is a 51 y.o. female was brought in with facial swelling failed outpatient treatment with doxycycline   VITAL SIGNS:  Blood pressure 116/63, pulse 76, temperature 97.7 F (36.5 C), temperature source Oral, resp. rate 19, height 4\' 5"  (1.346 m), weight 50.8 kg (112 lb), SpO2 99 %.   PHYSICAL EXAMINATION:  GENERAL:  51 y.o.-year-old patient lying in the bed with no acute distress.  EYES: Pupils equal, round, reactive to light and accommodation. No scleral icterus. Extraocular muscles intact.  HEENT: Head atraumatic, normocephalic. Oropharynx and nasopharynx clear.  NECK:  Supple, no jugular venous distention. No thyroid enlargement, no tenderness.  LUNGS: Normal breath sounds bilaterally, no wheezing, rales,rhonchi or crepitation. No use of accessory muscles of respiration.  CARDIOVASCULAR: S1, S2 normal. No murmurs, rubs, or gallops.  ABDOMEN: Soft, non-tender, non-distended. Bowel sounds present. No organomegaly or mass.  EXTREMITIES: No pedal edema, cyanosis, or  clubbing.  NEUROLOGIC: Cranial nerves II through XII are intact. Muscle strength 5/5 in all extremities. Sensation intact. Gait not checked.  PSYCHIATRIC: The patient is alert and oriented x 3.  SKIN: Right upper lip with only slight erythema and area start to showed drying of the skin. Only slight fluctuance to palpation today  DATA REVIEW:   CBC  Recent Labs Lab 12/27/16 0354  WBC 10.2  HGB 13.7  HCT 40.1  PLT 315    Chemistries   Recent Labs Lab 12/27/16 0354  NA 138  K 3.5  CL 107  CO2 24  GLUCOSE 90  BUN 11  CREATININE 0.79  CALCIUM 8.7*     Microbiology Results  Results for orders placed or performed during the hospital encounter of 09/26/16  Urine culture     Status: Abnormal   Collection Time: 09/26/16  7:22 AM  Result Value Ref Range Status   Specimen Description URINE, CLEAN CATCH  Final   Special Requests Normal  Final   Culture MULTIPLE SPECIES PRESENT, SUGGEST RECOLLECTION (A)  Final   Report Status 09/27/2016  FINAL  Final    RADIOLOGY:  Ct Maxillofacial W Contrast  Result Date: 12/27/2016 CLINICAL DATA:  Initial evaluation for right lip swelling, evaluate for abscess. The EXAM: CT MAXILLOFACIAL WITH CONTRAST TECHNIQUE: Multidetector CT imaging of the maxillofacial structures was performed. Multiplanar CT image reconstructions were also generated. A small metallic BB was placed on the right temple in order to reliably differentiate right from left. COMPARISON:  None. FINDINGS: Osseous: Study is severely degraded by motion artifact. No definite acute osseous abnormality within the face. Orbits: Globes and orbital soft tissues within normal limits. Sinuses: Chronic opacification within the maxillary sinuses bilaterally, right greater than left. Remainder of the paranasal sinuses are otherwise largely clear. Mastoid air cells and middle ear cavities are clear. Soft tissues: Evaluation of the facial soft tissues severely limited by motion, particularly at the  area of interest at the lips/ lower face. There is asymmetric soft tissue swelling with edema and inflammatory stranding at the right upper lip, extending laterally towards the right face/ masticator space, concerning for cellulitis. No definite discrete abscess identified on this motion degraded exam. Retro antral and parapharyngeal fat is preserved without evidence for root deeper extension of infection into the face. The end parotid glands are within normal limits. Submandibular glands grossly normal, although evaluation limited. No obvious adenopathy within the neck on this motion degraded exam. Evaluation the oral cavity fairly limited. Limited intracranial: Unremarkable. IMPRESSION: 1. Severely limited study due to extensive motion artifact. 2. Asymmetric swelling with edema and inflammatory stranding at the right upper lip and right face/masticator space, concerning for cellulitis. No definite abscess or drainable fluid collection identified on this motion degraded exam. If there is persistent high clinical suspicion for an underlying occult abscess, re- imaging when the patient is able to tolerate the exam is recommended. 3. Chronic maxillary sinusitis, right greater than left. Electronically Signed   By: Rise MuBenjamin  McClintock M.D.   On: 12/27/2016 00:54    Management plans discussed with the patient, family and they are in agreement.  CODE STATUS:     Code Status Orders        Start     Ordered   12/27/16 1516  Do not attempt resuscitation (DNR)  Continuous    Question Answer Comment  In the event of cardiac or respiratory ARREST Do not call a "code blue"   In the event of cardiac or respiratory ARREST Do not perform Intubation, CPR, defibrillation or ACLS   In the event of cardiac or respiratory ARREST Use medication by any route, position, wound care, and other measures to relive pain and suffering. May use oxygen, suction and manual treatment of airway obstruction as needed for comfort.    Comments nurse may pronounce      12/27/16 1515    Code Status History    Date Active Date Inactive Code Status Order ID Comments User Context   12/27/2016  3:05 AM 12/27/2016  3:15 PM Full Code [id number]  Ihor AustinPyreddy, Pavan, MD Inpatient   04/19/2016  5:01 PM 04/20/2016  8:28 PM Full Code [id number]  Shaune Pollackhen, Qing, MD Inpatient   03/15/2015  6:00 AM 03/16/2015  8:07 PM Full Code [id number]  Ida RogueLundquist, Christopher, MD Inpatient    Advance Directive Documentation     Most Recent Value  Type of Advance Directive  Healthcare Power of Attorney, Living will  Pre-existing out of facility DNR order (yellow form or pink MOST form)  -- [POA will bring DNR forms to hospital]  "MOST" Form in  Place?  -      TOTAL TIME TAKING CARE OF THIS PATIENT: 35 minutes.    Alford Highland M.D on 12/28/2016 at 4:51 PM  Between 7am to 6pm - Pager - [phone]  After 6pm go to [email or web address] - password [email or web address]  [facility] Office  [phone]  CC: Primary care physician; Kandyce Rud, MD

## 2016-12-28 NOTE — Plan of Care (Signed)
Problem: Safety: Goal: Ability to remain free from injury will improve Outcome: Progressing Pt is alert and has family at bedside. Family members alert staff to pt needs.  Problem: Pain Managment: Goal: General experience of comfort will improve Outcome: Progressing No complaints of pain this shift.  Problem: Activity: Goal: Risk for activity intolerance will decrease Outcome: Progressing Pt ambulates to the bathroom with assistance from staff and family members.

## 2016-12-28 NOTE — Progress Notes (Signed)
Patient is being discharged with sister, POA. Patient has no complaints or SS of distress.

## 2016-12-28 NOTE — Care Management Note (Signed)
Case Management Note  Patient Details  Name: Lauren Nixon MRN: [id number] Date of Birth: 24-May-1966  Subjective/Objective:  RNCM consult for home health needs. Spoke with Lenard GallowaySteve Swenson, patients brother,  who is at bedside.He states patient lives with his sister and is well cared for. Patient dependent on sister for all adls needs. Ivor Messier. Regnier denies any home health needs.  PCP is Dr. Larwance SachsBabaoff. Patient will discharge home by car with her sister.                   Action/Plan: Home with care by sister. Case Closed.   Expected Discharge Date:  12/28/16               Expected Discharge Plan:  Home/Self Care  In-House Referral:     Discharge planning Services  CM Consult  Post Acute Care Choice:    Choice offered to:     DME Arranged:    DME Agency:     HH Arranged:    HH Agency:     Status of Service:  Completed, signed off  If discussed at [employer] Length of Stay Meetings, dates discussed:    Additional Comments:  Marily MemosLisa M Codee Tutson, RN 12/28/2016, 10:08 AM

## 2016-12-28 NOTE — Progress Notes (Signed)
Pt is alert. No complaints of pain this shift. Up to bathroom with assistance. Family member is at bedside. Pt with periods of crying spells this shift. Pt was able to sleep in between care.

## 2017-01-27 DIAGNOSIS — Z933 Colostomy status: Secondary | ICD-10-CM | POA: Diagnosis not present

## 2017-02-13 DIAGNOSIS — F419 Anxiety disorder, unspecified: Secondary | ICD-10-CM | POA: Diagnosis not present

## 2017-02-13 DIAGNOSIS — Z79899 Other long term (current) drug therapy: Secondary | ICD-10-CM | POA: Diagnosis not present

## 2017-02-28 DIAGNOSIS — Z933 Colostomy status: Secondary | ICD-10-CM | POA: Diagnosis not present

## 2017-03-18 DIAGNOSIS — J069 Acute upper respiratory infection, unspecified: Secondary | ICD-10-CM | POA: Diagnosis not present

## 2017-03-23 DIAGNOSIS — Q909 Down syndrome, unspecified: Secondary | ICD-10-CM | POA: Diagnosis not present

## 2017-03-23 DIAGNOSIS — F0280 Dementia in other diseases classified elsewhere without behavioral disturbance: Secondary | ICD-10-CM | POA: Diagnosis not present

## 2017-03-23 DIAGNOSIS — R569 Unspecified convulsions: Secondary | ICD-10-CM | POA: Diagnosis not present

## 2017-04-08 ENCOUNTER — Emergency Department
Admission: EM | Admit: 2017-04-08 | Discharge: 2017-04-08 | Disposition: A | Discharge status: Home / Self Care | Payer: Medicare HMO | Attending: Emergency Medicine | Admitting: Emergency Medicine

## 2017-04-08 ENCOUNTER — Encounter: Payer: Self-pay | Admitting: Emergency Medicine

## 2017-04-08 ENCOUNTER — Emergency Department: Payer: Medicare HMO

## 2017-04-08 DIAGNOSIS — F0390 Unspecified dementia without behavioral disturbance: Secondary | ICD-10-CM | POA: Diagnosis not present

## 2017-04-08 DIAGNOSIS — S0990XA Unspecified injury of head, initial encounter: Secondary | ICD-10-CM | POA: Diagnosis not present

## 2017-04-08 DIAGNOSIS — Y9389 Activity, other specified: Secondary | ICD-10-CM | POA: Diagnosis not present

## 2017-04-08 DIAGNOSIS — S0101XA Laceration without foreign body of scalp, initial encounter: Secondary | ICD-10-CM | POA: Diagnosis not present

## 2017-04-08 DIAGNOSIS — Y92003 Bedroom of unspecified non-institutional (private) residence as the place of occurrence of the external cause: Secondary | ICD-10-CM | POA: Insufficient documentation

## 2017-04-08 DIAGNOSIS — Y998 Other external cause status: Secondary | ICD-10-CM | POA: Diagnosis not present

## 2017-04-08 DIAGNOSIS — W06XXXA Fall from bed, initial encounter: Secondary | ICD-10-CM | POA: Insufficient documentation

## 2017-04-08 DIAGNOSIS — S199XXA Unspecified injury of neck, initial encounter: Secondary | ICD-10-CM | POA: Diagnosis not present

## 2017-04-08 DIAGNOSIS — R9431 Abnormal electrocardiogram [ECG] [EKG]: Secondary | ICD-10-CM | POA: Diagnosis not present

## 2017-04-08 DIAGNOSIS — W19XXXA Unspecified fall, initial encounter: Secondary | ICD-10-CM | POA: Diagnosis not present

## 2017-04-08 DIAGNOSIS — Q909 Down syndrome, unspecified: Secondary | ICD-10-CM | POA: Insufficient documentation

## 2017-04-08 MED ORDER — LIDOCAINE-EPINEPHRINE 1 %-1:100000 IJ SOLN
10.0000 mL | Freq: Once | INTRAMUSCULAR | Status: AC
  Administered 2017-04-08: 10 mL via INTRADERMAL
  Filled 2017-04-08: qty 10

## 2017-04-08 MED ORDER — BACITRACIN-NEOMYCIN-POLYMYXIN 400-5-5000 EX OINT
TOPICAL_OINTMENT | Freq: Once | CUTANEOUS | Status: AC
  Administered 2017-04-08: 1 via TOPICAL
  Filled 2017-04-08: qty 1

## 2017-04-08 MED ORDER — LIDOCAINE-EPINEPHRINE-TETRACAINE (LET) SOLUTION
3.0000 mL | Freq: Once | NASAL | Status: AC
  Administered 2017-04-08: 3 mL via TOPICAL
  Filled 2017-04-08: qty 3

## 2017-04-08 NOTE — Discharge Instructions (Signed)
Lauren Nixon may take showers, but should not soak her laceration. She will need to have her staples removed in 5-7 days with her primary care physician.  You may apply a triple antibiotic cream and a thick coat 3 times daily to the laceration.  Return to the emergency department or seek immediate medical attention if you notice swelling, warmth, pus drainage, fever, changes in mental status, vomiting, if Lauren Nixon is too sleepy, or for any other symptoms concerning to you.

## 2017-04-08 NOTE — ED Notes (Signed)
Called pharmacy for the lido with epi

## 2017-04-08 NOTE — ED Notes (Signed)
E-signature not working for legal guardian to sign. Tried to print, however printed to label machine

## 2017-04-08 NOTE — ED Provider Notes (Signed)
[facility] Emergency Department Provider Note  ____________________________________________  Time seen: Approximately 12:45 PM  I have reviewed the triage vital signs and the nursing notes.   HISTORY  Chief Complaint Fall and Laceration  The patient's history is limited due to her dementia and Down's syndrome. The history is obtained of the patient's sister, who is her Your giver.  HPI Lauren Nixon is a 51 y.o. female with a history of Down syndrome, dementia, presenting w/ a mechanical fall and scalp laceration.  The patient was with her sister/caregiver, holding onto the edge of the bed,trying to put on pants, when she lost her balance and fell  She fell onto her buttocks first, and then struck her head on a cedar trunk, resulting in a lacerationto the posterior scalp. No loss of consciousness, no report of headache, no nausea or vomiting. The patient ambulates with assistance at baseline and was able to relate to the stretcher with assistance. No recent illness inclduing n/v/d, malodorous urine, fever, chills.  The patient denies any pain at this time.   Past Medical History:  Diagnosis Date  . Dementia   . Down's syndrome   . GERD (gastroesophageal reflux disease)   . Hirschsprung disease   . Thyroid disease   . Ventral hernia     Patient Active Problem List   Diagnosis Date Noted  . Facial cellulitis 12/27/2016  . Pneumonia 04/19/2016  . Pure hypercholesterolemia 03/29/2016  . Aftercare following surgery 03/20/2015  . 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 Disease of thyroid gland 03/19/2015  . Incarcerated ventral hernia 03/15/2015  . Partial bowel obstruction (HCC)   . Ventral hernia with obstruction and without gangrene   . Allergic rhinitis, seasonal 12/23/2014  . Gastro-esophageal reflux disease without esophagitis 12/23/2014  . 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 COLOSTOMY     . VENTRAL HERNIA REPAIR N/A 03/15/2015   Procedure: VENTRAL HERNIA REPAIR FOR INCARCERATED VENTRAL HERNIA ;  Surgeon: Ida Roguehristopher Lundquist, MD;  Location: [facility] ORS;  Service: General;  Laterality: N/A;    Current Outpatient Rx  . Order #: [id number] Class: Historical Med  . Order #: [id number] Class: Historical Med  . Order #: [id number] Class: Historical Med  . Order #: [id number] Class: Historical Med  . Order #: [id number] Class: Historical Med  . Order #: [id number] Class: Historical Med  . Order #: [id number] Class: Historical Med  . Order #: [id number] Class: Historical Med  . Order #: [id number] Class: Historical Med  . Order #: [id number] Class: Historical Med  . Order #: [id number] Class: Historical Med  . Order #: [id number] Class: Historical Med  . Order #: [id number] Class: Print  . Order #: [id number] Class: Historical Med    Allergies Penicillins  Family History  Problem Relation Age of Onset  . Hypertension Father     Social History Social History  Substance Use Topics  . Smoking status: Never Smoker  . Smokeless tobacco: Never Used  . Alcohol use No    Review of Systems May be limited due to pt's dementia and Down's syndrome Constitutional: No fever/chills. No lightheadedness or syncope.  + mechanical fall w/o LOC. Eyes: No visual changes. ENT: No sore throat. No congestion or rhinorrhea. Cardiovascular: Denies chest pain. Denies palpitations. Respiratory: Denies shortness of breath.  No cough. Gastrointestinal: No abdominal pain.  No nausea, no vomiting.  No diarrhea.  No constipation. Genitourinary: Negative for malodorous urine. Musculoskeletal: Negative for back pain. No neck pain. Skin: Negative for rash. + for laceration to posterior  scalp. Neurological: Negative for headaches. No focal numbness, tingling or weakness.     ____________________________________________   PHYSICAL EXAM:  VITAL SIGNS: ED Triage Vitals  Enc Vitals Group     BP 04/08/17 1229 102/77      Pulse Rate 04/08/17 1229 83     Resp 04/08/17 1229 20     Temp 04/08/17 1229 98.9 F (37.2 C)     Temp Source 04/08/17 1229 Oral     SpO2 04/08/17 1229 95 %     Weight 04/08/17 1232 103 lb (46.7 kg)     Height 04/08/17 1232 4\' 10"  (1.473 m)     Head Circumference --      Peak Flow --      Pain Score --      Pain Loc --      Pain Edu? --      Excl. in GC? --     Constitutional: The patient is alert, comfortable, and demented.  She answers questions at baseline, per her sister.  GCS 15. Eyes: Conjunctivae are normal.  EOMI. PERRLA.  No raccoon eyes. No scleral icterus. Head: 1.5cm lac to posterior scalp w/ mild surrounding swelling. No Battle's sign. Nose: No congestion/rhinnorhea.no swelling over the nose or septal hemtoma.  Mouth/Throat: Mucous membranes are moist. No malocclusion or dental injury. Neck: No stridor.  Supple.  No midline cspine ttp, step offs or deformities.  Full ROM w/o pain. Cardiovascular: Normal rate, regular rhythm. No murmurs, rubs or gallops.  Respiratory: Normal respiratory effort.  No accessory muscle use or retractions. Lungs CTAB.  No wheezes, rales or ronchi. Gastrointestinal: Soft, nontender and nondistended.  No guarding or rebound.  No peritoneal signs. Musculoskeletal: Pelvis is stable. No evidence of injury or ecchymosis or contusion on the back. No midline C-spine, T-spine, L-spine tenderness to palpation, step-offs or deformities. Full range of motion of the bilateral shoulders, elbows, wrists, hips, knees, ankles without pain.No LE edema. No ttp in the calves or palpable cords.  Negative Homan's sign. Neurologic:  Alert.  Speech is clear.  Face and smile are symmetric.  EOMI.  Moves all extremities well. Skin:  Skin is warm, dry. No rash noted. Psychiatric: Mood and affect are normal.   ____________________________________________   LABS (all labs ordered are listed, but only abnormal results are displayed)  Labs Reviewed - No data to  display ____________________________________________  EKG  ED ECG REPORT I, Rockne Menghini, the attending physician, personally viewed and interpreted this ECG.   Date: 04/08/2017  EKG Time: 1331  Rate: 81  Rhythm: normal sinus rhythm  Axis: leftward  Intervals:none  ST&T Change: No STEMI  ____________________________________________  RADIOLOGY  Ct Head Wo Contrast  Result Date: 04/08/2017 CLINICAL DATA:  51 year old female status post fall while getting out of bed earlier today EXAM: CT HEAD WITHOUT CONTRAST CT CERVICAL SPINE WITHOUT CONTRAST TECHNIQUE: Multidetector CT imaging of the head and cervical spine was performed following the standard protocol without intravenous contrast. Multiplanar CT image reconstructions of the cervical spine were also generated. COMPARISON:  Prior head CT 09/26/2016 FINDINGS: CT HEAD FINDINGS Brain: No evidence of acute infarction, hemorrhage, hydrocephalus, extra-axial collection or mass lesion/mass effect. Similar degree of cortical and central atrophy with resultant ex vacuo ventriculomegaly. Mild periventricular, subcortical and deep white matter hypoattenuation consistent with chronic microvascular ischemic white matter change. Vascular: No hyperdense vessel or unexpected calcification. Skull: Normal. Negative for fracture or focal lesion. Sinuses/Orbits: No acute finding. Other: None. CT CERVICAL SPINE FINDINGS Alignment: Normal. Skull base  and vertebrae: No acute fracture. No primary bone lesion or focal pathologic process. Soft tissues and spinal canal: No prevertebral fluid or swelling. No visible canal hematoma. Disc levels: Multilevel degenerative disc disease. There is approximately 4 mm of anterolisthesis of C4 on C5, and 3 mm of anterolisthesis of C5 on C6 which is presumably degenerative in etiology. This results in reversal of the normal cervical lordosis. Multilevel degenerative disc disease is present most notable at C5-C6, C6-C7.  Bilateral facet arthropathy is present, particularly on the right at C3-C4 and C4-C5. Upper chest: Negative. Other: None IMPRESSION: CT HEAD 1. No acute intracranial abnormality. 2. Stable atrophy and resultant ex vacuo ventriculomegaly. 3. Stable chronic microvascular ischemic white matter changes. CT CSPINE 1. No acute fracture or malalignment. 2. Reversal of the normal cervical lordosis is favored to be degenerative in nature and related to chronic degenerative anterolisthesis of C4 on C5 and C5 on C6. 3. Multilevel degenerative disc disease and right worse than left facet arthropathy. Electronically Signed   By: Malachy Moan M.D.   On: 04/08/2017 13:34   Ct Cervical Spine Wo Contrast  Result Date: 04/08/2017 CLINICAL DATA:  51 year old female status post fall while getting out of bed earlier today EXAM: CT HEAD WITHOUT CONTRAST CT CERVICAL SPINE WITHOUT CONTRAST TECHNIQUE: Multidetector CT imaging of the head and cervical spine was performed following the standard protocol without intravenous contrast. Multiplanar CT image reconstructions of the cervical spine were also generated. COMPARISON:  Prior head CT 09/26/2016 FINDINGS: CT HEAD FINDINGS Brain: No evidence of acute infarction, hemorrhage, hydrocephalus, extra-axial collection or mass lesion/mass effect. Similar degree of cortical and central atrophy with resultant ex vacuo ventriculomegaly. Mild periventricular, subcortical and deep white matter hypoattenuation consistent with chronic microvascular ischemic white matter change. Vascular: No hyperdense vessel or unexpected calcification. Skull: Normal. Negative for fracture or focal lesion. Sinuses/Orbits: No acute finding. Other: None. CT CERVICAL SPINE FINDINGS Alignment: Normal. Skull base and vertebrae: No acute fracture. No primary bone lesion or focal pathologic process. Soft tissues and spinal canal: No prevertebral fluid or swelling. No visible canal hematoma. Disc levels: Multilevel  degenerative disc disease. There is approximately 4 mm of anterolisthesis of C4 on C5, and 3 mm of anterolisthesis of C5 on C6 which is presumably degenerative in etiology. This results in reversal of the normal cervical lordosis. Multilevel degenerative disc disease is present most notable at C5-C6, C6-C7. Bilateral facet arthropathy is present, particularly on the right at C3-C4 and C4-C5. Upper chest: Negative. Other: None IMPRESSION: CT HEAD 1. No acute intracranial abnormality. 2. Stable atrophy and resultant ex vacuo ventriculomegaly. 3. Stable chronic microvascular ischemic white matter changes. CT CSPINE 1. No acute fracture or malalignment. 2. Reversal of the normal cervical lordosis is favored to be degenerative in nature and related to chronic degenerative anterolisthesis of C4 on C5 and C5 on C6. 3. Multilevel degenerative disc disease and right worse than left facet arthropathy. Electronically Signed   By: Malachy Moan M.D.   On: 04/08/2017 13:34    ____________________________________________   PROCEDURES  Procedure(s) performed: None  Procedures  Critical Care performed: No ____________________________________________   INITIAL IMPRESSION / ASSESSMENT AND PLAN / ED COURSE  Pertinent labs & imaging results that were available during my care of the patient were reviewed by me and considered in my medical decision making (see chart for details).  51 y.o. F w/ dementia and Down's syndrome presenting w/ mechanical fall and scalp lac.  We will evaluate for acute intracranial pathology, or  C-spine injury although the patient has no clinical symptoms that would be suggestive of these,  She is not a reliable patient.  This was a witnessed fall and there is no suggestion of syncope or sz.  Plan CT head and Cspine, lac repair, re-eval for final disposition.  LACERATION REPAIR Performed by: Rockne Menghini Authorized by: Rockne Menghini Consent: Verbal consent  obtained. Risks and benefits: risks, benefits and alternatives were discussed Consent given by: patient Patient identity confirmed: provided demographic data Prepped and Draped in normal sterile fashion Wound explored  Laceration Location: posterior scalp  Laceration Length: 1.5cm  No Foreign Bodies seen or palpated  Anesthesia: local infiltration  Local anesthetic: LET, lidocaine 1% with epinephrine  Anesthetic total: 2 ml  Irrigation method: syringe Amount of cleaning: standard  Skin closure: staples  Number of sutures: 2 staples  Technique: simple  Patient tolerance: Patient tolerated the procedure well with no immediate complications.   ----------------------------------------- 1:58 PM on 04/08/2017 -----------------------------------------  The patient's CT scan does not show  Intracranial or cervical spine injury. She is continued to remain stable since arriving to the emergency department. She has not had any nausea or vomiting, or complained of any pain. Her laceration has been stapled, and I have given extensive care instructions to the patient's sister and sister-in-law.the patient has received a tetanus booster. At this time, the patient is stable for discharge home. Return precautions as well as follow-up instructions were disce patient's family and written her discharge paperwork.  ____________________________________________  FINAL CLINICAL IMPRESSION(S) / ED DIAGNOSES  Final diagnoses:  Fall, initial encounter  Laceration of scalp, initial encounter         NEW MEDICATIONS STARTED DURING THIS VISIT:  New Prescriptions   No medications on file      Rockne Menghini, MD 04/08/17 1400

## 2017-04-08 NOTE — ED Triage Notes (Addendum)
Pt was being helped to get her pants on, tripped and fell backwards and hit head on a chest. Small lac to back of head.

## 2017-04-17 DIAGNOSIS — Z23 Encounter for immunization: Secondary | ICD-10-CM | POA: Diagnosis not present

## 2017-04-17 DIAGNOSIS — S0101XD Laceration without foreign body of scalp, subsequent encounter: Secondary | ICD-10-CM | POA: Diagnosis not present

## 2017-04-21 DIAGNOSIS — Q909 Down syndrome, unspecified: Secondary | ICD-10-CM | POA: Diagnosis not present

## 2017-04-21 DIAGNOSIS — R569 Unspecified convulsions: Secondary | ICD-10-CM | POA: Diagnosis not present

## 2017-04-21 DIAGNOSIS — F0280 Dementia in other diseases classified elsewhere without behavioral disturbance: Secondary | ICD-10-CM | POA: Diagnosis not present

## 2017-04-24 DIAGNOSIS — E782 Mixed hyperlipidemia: Secondary | ICD-10-CM | POA: Diagnosis not present

## 2017-04-24 DIAGNOSIS — E039 Hypothyroidism, unspecified: Secondary | ICD-10-CM | POA: Diagnosis not present

## 2017-04-24 DIAGNOSIS — E559 Vitamin D deficiency, unspecified: Secondary | ICD-10-CM | POA: Diagnosis not present

## 2017-04-24 DIAGNOSIS — Z1231 Encounter for screening mammogram for malignant neoplasm of breast: Secondary | ICD-10-CM | POA: Diagnosis not present

## 2017-05-01 DIAGNOSIS — E039 Hypothyroidism, unspecified: Secondary | ICD-10-CM | POA: Diagnosis not present

## 2017-05-01 DIAGNOSIS — Q909 Down syndrome, unspecified: Secondary | ICD-10-CM | POA: Diagnosis not present

## 2017-05-01 DIAGNOSIS — F0280 Dementia in other diseases classified elsewhere without behavioral disturbance: Secondary | ICD-10-CM | POA: Diagnosis not present

## 2017-05-01 DIAGNOSIS — K219 Gastro-esophageal reflux disease without esophagitis: Secondary | ICD-10-CM | POA: Diagnosis not present

## 2017-05-02 DIAGNOSIS — Z933 Colostomy status: Secondary | ICD-10-CM | POA: Diagnosis not present

## 2017-05-18 DIAGNOSIS — G309 Alzheimer's disease, unspecified: Secondary | ICD-10-CM | POA: Diagnosis not present

## 2017-05-18 DIAGNOSIS — F0281 Dementia in other diseases classified elsewhere with behavioral disturbance: Secondary | ICD-10-CM | POA: Diagnosis not present

## 2017-05-18 DIAGNOSIS — R634 Abnormal weight loss: Secondary | ICD-10-CM | POA: Diagnosis not present

## 2017-05-18 DIAGNOSIS — Q431 Hirschsprung's disease: Secondary | ICD-10-CM | POA: Diagnosis not present

## 2017-06-01 DIAGNOSIS — Z933 Colostomy status: Secondary | ICD-10-CM | POA: Diagnosis not present

## 2017-08-16 DIAGNOSIS — G309 Alzheimer's disease, unspecified: Secondary | ICD-10-CM | POA: Diagnosis not present

## 2017-08-16 DIAGNOSIS — R634 Abnormal weight loss: Secondary | ICD-10-CM | POA: Diagnosis not present

## 2017-08-16 DIAGNOSIS — F0281 Dementia in other diseases classified elsewhere with behavioral disturbance: Secondary | ICD-10-CM | POA: Diagnosis not present

## 2017-08-16 DIAGNOSIS — Q909 Down syndrome, unspecified: Secondary | ICD-10-CM | POA: Diagnosis not present

## 2017-09-01 DIAGNOSIS — Z933 Colostomy status: Secondary | ICD-10-CM | POA: Diagnosis not present

## 2017-09-14 DIAGNOSIS — N898 Other specified noninflammatory disorders of vagina: Secondary | ICD-10-CM | POA: Diagnosis not present

## 2017-11-03 IMAGING — CT CT CERVICAL SPINE W/O CM
3 of 7 series · 13 of 33 positions shown, 15 images · non-contrast
Comparison: Prior head CT 09/26/2016

CLINICAL DATA: 51-year-old female status post fall while getting
out of bed earlier today

EXAM:
CT HEAD WITHOUT CONTRAST
CT CERVICAL SPINE WITHOUT CONTRAST
TECHNIQUE: Multidetector CT imaging of the head and cervical spine was
performed following the standard protocol without intravenous
contrast. Multiplanar CT image reconstructions of the cervical spine
were also generated.

[Series 8: coronal soft tissue · coronal · 0.26mm/px · 3 of 60 slices shown]
[im 15/60  bone]
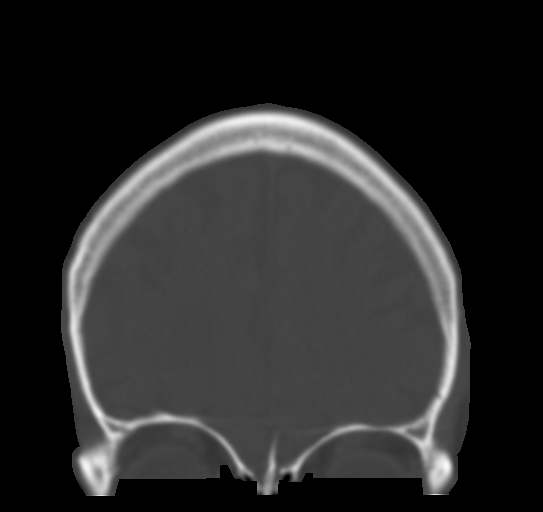
[im 30/60  bone]
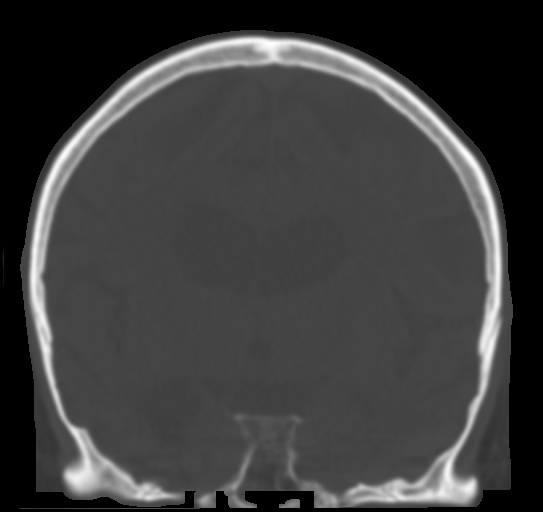
[im 45/60  bone]
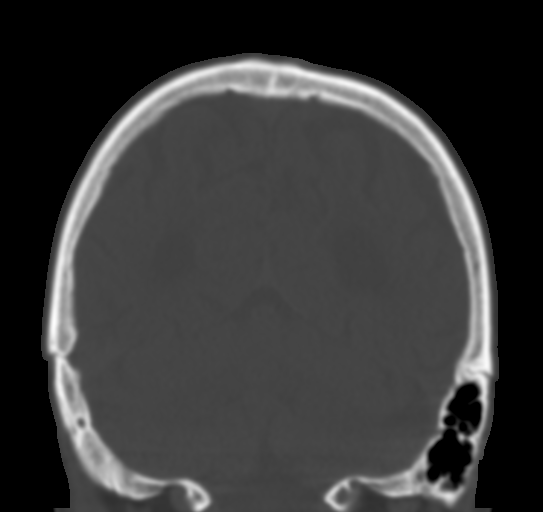

[Series 10: sagittal bone · sagittal · 0.23mm/px · 5 of 66 slices shown]
[im 11/66  bone]
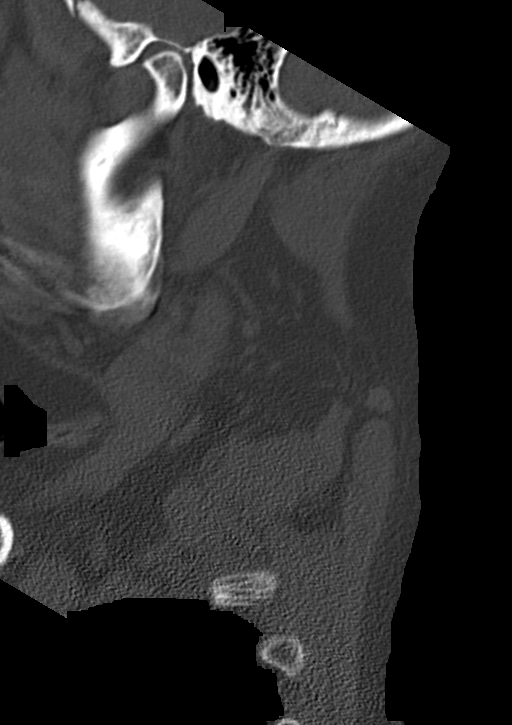
[im 22/66  bone]
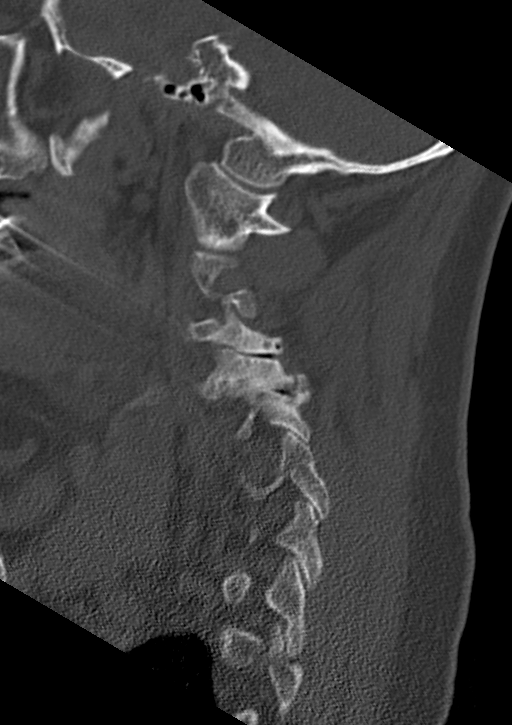
[im 33/66  bone]
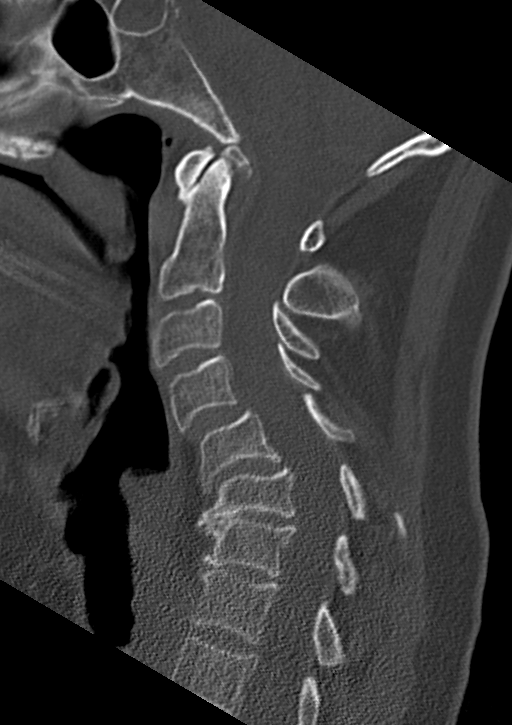
[im 44/66  bone]
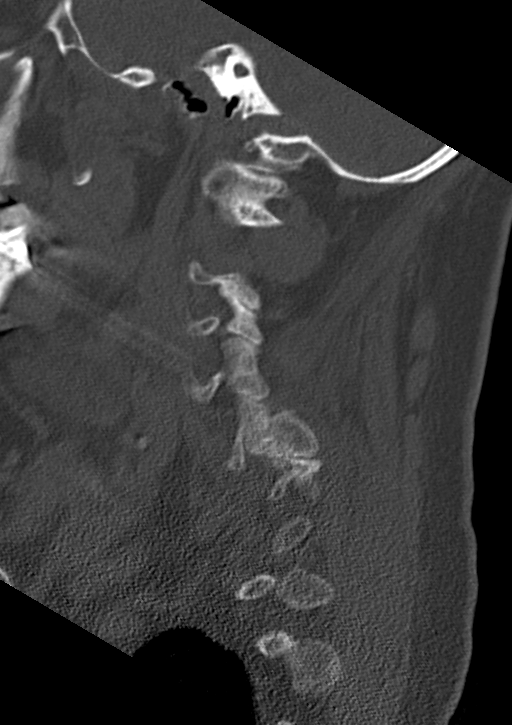
[im 55/66  bone]
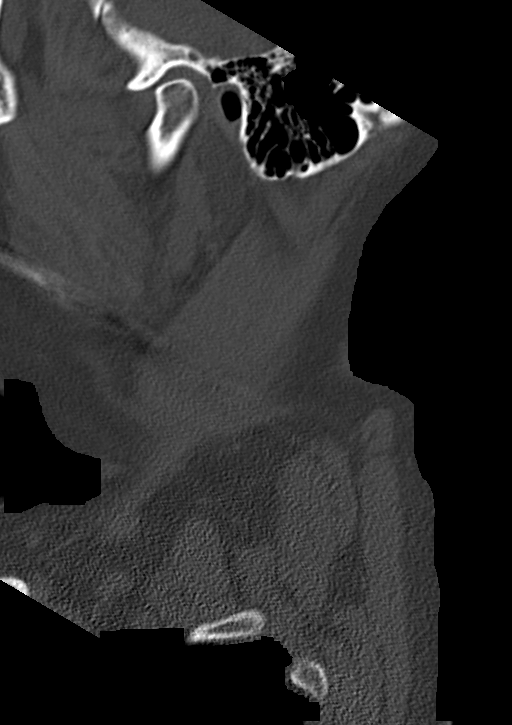

[Series 12: orthogonal bone · axial · 0.25mm/px · z∈[-282,-188]mm · 5 of 85 slices shown, 7 images]
[im 15/85  soft-tissue]
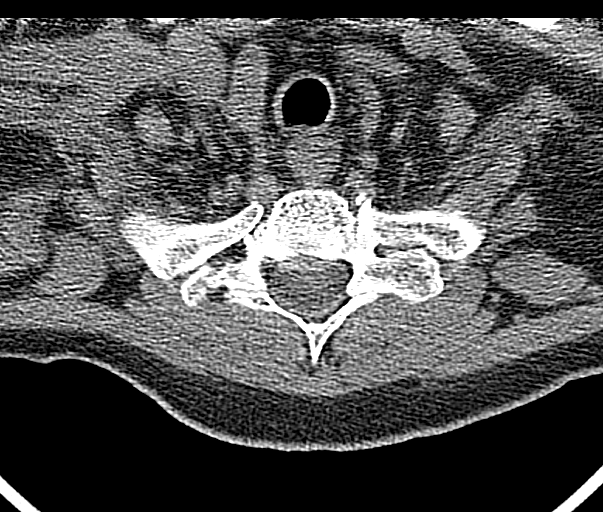
[im 15/85  bone]
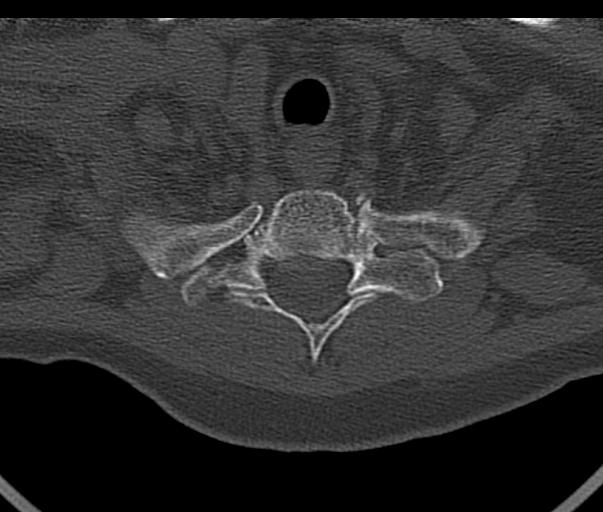
[im 29/85  bone]
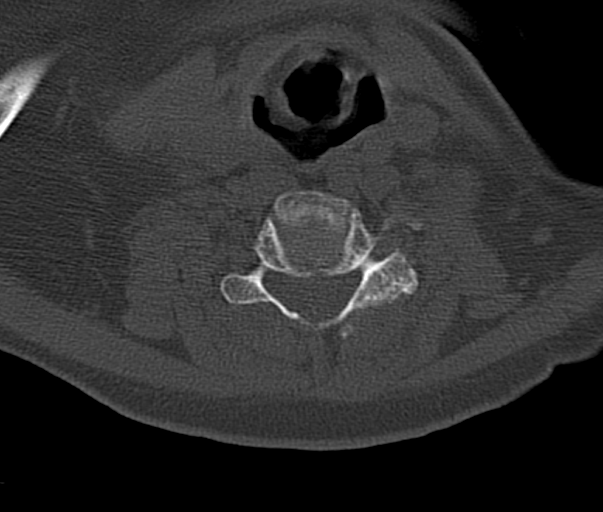
[im 43/85  bone]
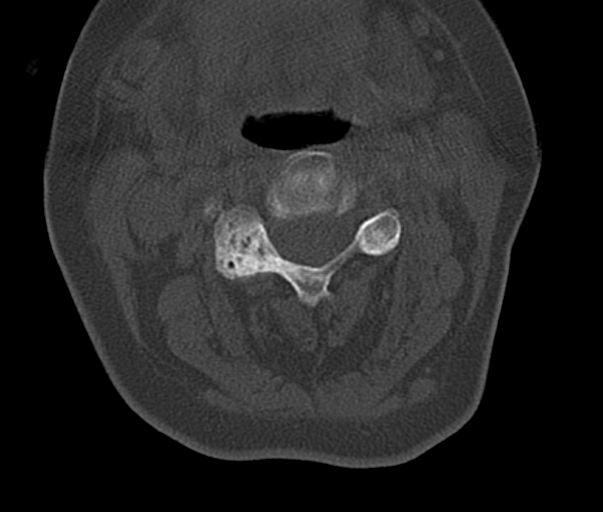
[im 57/85  bone]
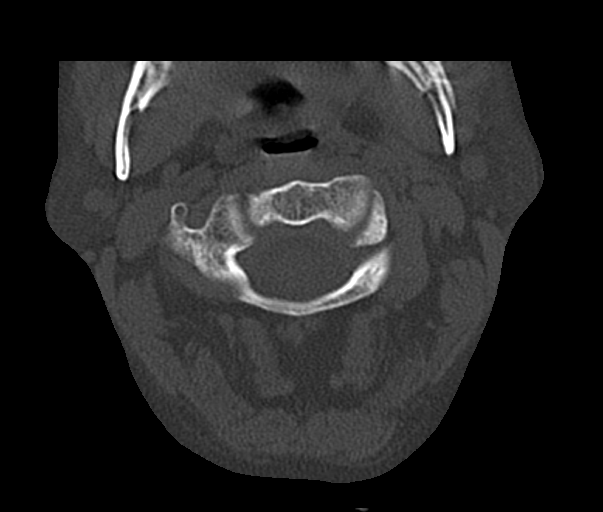
[im 71/85  soft-tissue]
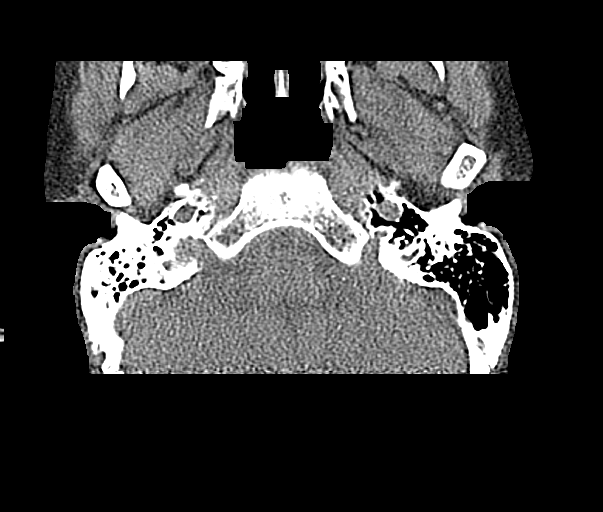
[im 71/85  bone]
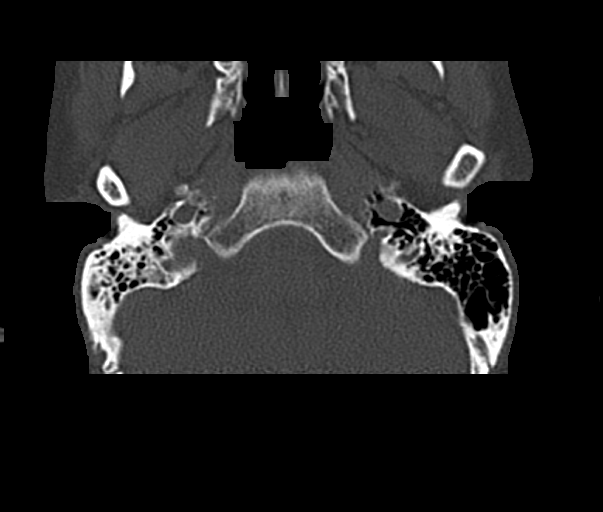

[13 of 33 positions shown; findings below may reference images not displayed]

FINDINGS: CT HEAD FINDINGS

Brain: No evidence of acute infarction, hemorrhage, hydrocephalus,
extra-axial collection or mass lesion/mass effect. Similar degree of
cortical and central atrophy with resultant ex vacuo
ventriculomegaly. Mild periventricular, subcortical and deep white
matter hypoattenuation consistent with chronic microvascular
ischemic white matter change.

Vascular: No hyperdense vessel or unexpected calcification.

Skull: Normal. Negative for fracture or focal lesion.

Sinuses/Orbits: No acute finding.

Other: None.

CT CERVICAL SPINE FINDINGS

Alignment: Normal.

Skull base and vertebrae: No acute fracture. No primary bone lesion
or focal pathologic process.

Soft tissues and spinal canal: No prevertebral fluid or swelling. No
visible canal hematoma.

Disc levels: Multilevel degenerative disc disease. There is
approximately 4 mm of anterolisthesis of C4 on C5, and 3 mm of
anterolisthesis of C5 on C6 which is presumably degenerative in
etiology. This results in reversal of the normal cervical lordosis.
Multilevel degenerative disc disease is present most notable at
C5-C6, C6-C7. Bilateral facet arthropathy is present, particularly
on the right at C3-C4 and C4-C5.

Upper chest: Negative.

Other: None
IMPRESSION: CT HEAD

1. No acute intracranial abnormality.
2. Stable atrophy and resultant ex vacuo ventriculomegaly.
3. Stable chronic microvascular ischemic white matter changes.
CT CSPINE

1. No acute fracture or malalignment.
2. Reversal of the normal cervical lordosis is favored to be
degenerative in nature and related to chronic degenerative
anterolisthesis of C4 on C5 and C5 on C6.
3. Multilevel degenerative disc disease and right worse than left
facet arthropathy.

## 2017-11-14 DIAGNOSIS — G309 Alzheimer's disease, unspecified: Secondary | ICD-10-CM | POA: Diagnosis not present

## 2017-11-14 DIAGNOSIS — Q909 Down syndrome, unspecified: Secondary | ICD-10-CM | POA: Diagnosis not present

## 2017-11-14 DIAGNOSIS — F0281 Dementia in other diseases classified elsewhere with behavioral disturbance: Secondary | ICD-10-CM | POA: Diagnosis not present

## 2017-11-14 DIAGNOSIS — R634 Abnormal weight loss: Secondary | ICD-10-CM | POA: Diagnosis not present

## 2018-01-13 DIAGNOSIS — Q909 Down syndrome, unspecified: Secondary | ICD-10-CM | POA: Diagnosis not present

## 2018-01-13 DIAGNOSIS — G309 Alzheimer's disease, unspecified: Secondary | ICD-10-CM | POA: Diagnosis not present

## 2018-01-13 DIAGNOSIS — F0281 Dementia in other diseases classified elsewhere with behavioral disturbance: Secondary | ICD-10-CM | POA: Diagnosis not present

## 2018-01-13 DIAGNOSIS — R634 Abnormal weight loss: Secondary | ICD-10-CM | POA: Diagnosis not present

## 2018-03-14 DIAGNOSIS — Z933 Colostomy status: Secondary | ICD-10-CM | POA: Diagnosis not present

## 2018-03-29 DIAGNOSIS — R634 Abnormal weight loss: Secondary | ICD-10-CM | POA: Diagnosis not present

## 2018-03-29 DIAGNOSIS — Q909 Down syndrome, unspecified: Secondary | ICD-10-CM | POA: Diagnosis not present

## 2018-03-29 DIAGNOSIS — G309 Alzheimer's disease, unspecified: Secondary | ICD-10-CM | POA: Diagnosis not present

## 2018-03-29 DIAGNOSIS — F0281 Dementia in other diseases classified elsewhere with behavioral disturbance: Secondary | ICD-10-CM | POA: Diagnosis not present

## 2018-04-13 DIAGNOSIS — Z933 Colostomy status: Secondary | ICD-10-CM | POA: Diagnosis not present

## 2018-04-27 DIAGNOSIS — Q909 Down syndrome, unspecified: Secondary | ICD-10-CM | POA: Diagnosis not present

## 2018-04-27 DIAGNOSIS — G40909 Epilepsy, unspecified, not intractable, without status epilepticus: Secondary | ICD-10-CM | POA: Diagnosis not present

## 2018-04-27 DIAGNOSIS — F0280 Dementia in other diseases classified elsewhere without behavioral disturbance: Secondary | ICD-10-CM | POA: Diagnosis not present

## 2018-05-07 ENCOUNTER — Encounter: Payer: Self-pay | Admitting: *Deleted

## 2018-05-07 ENCOUNTER — Emergency Department

## 2018-05-07 ENCOUNTER — Emergency Department
Admission: EM | Admit: 2018-05-07 | Discharge: 2018-05-07 | Disposition: A | Discharge status: Home / Self Care | Attending: Emergency Medicine | Admitting: Emergency Medicine

## 2018-05-07 ENCOUNTER — Other Ambulatory Visit: Payer: Self-pay

## 2018-05-07 DIAGNOSIS — S4992XA Unspecified injury of left shoulder and upper arm, initial encounter: Secondary | ICD-10-CM | POA: Diagnosis present

## 2018-05-07 DIAGNOSIS — Y33XXXA Other specified events, undetermined intent, initial encounter: Secondary | ICD-10-CM | POA: Insufficient documentation

## 2018-05-07 DIAGNOSIS — F0390 Unspecified dementia without behavioral disturbance: Secondary | ICD-10-CM | POA: Insufficient documentation

## 2018-05-07 DIAGNOSIS — Q909 Down syndrome, unspecified: Secondary | ICD-10-CM | POA: Diagnosis not present

## 2018-05-07 DIAGNOSIS — S5012XA Contusion of left forearm, initial encounter: Secondary | ICD-10-CM | POA: Diagnosis not present

## 2018-05-07 DIAGNOSIS — Y939 Activity, unspecified: Secondary | ICD-10-CM | POA: Insufficient documentation

## 2018-05-07 DIAGNOSIS — Z79899 Other long term (current) drug therapy: Secondary | ICD-10-CM | POA: Diagnosis not present

## 2018-05-07 DIAGNOSIS — Y92129 Unspecified place in nursing home as the place of occurrence of the external cause: Secondary | ICD-10-CM | POA: Insufficient documentation

## 2018-05-07 DIAGNOSIS — Z7982 Long term (current) use of aspirin: Secondary | ICD-10-CM | POA: Insufficient documentation

## 2018-05-07 DIAGNOSIS — S42412A Displaced simple supracondylar fracture without intercondylar fracture of left humerus, initial encounter for closed fracture: Secondary | ICD-10-CM | POA: Diagnosis not present

## 2018-05-07 DIAGNOSIS — M25522 Pain in left elbow: Secondary | ICD-10-CM | POA: Diagnosis not present

## 2018-05-07 DIAGNOSIS — Y999 Unspecified external cause status: Secondary | ICD-10-CM | POA: Diagnosis not present

## 2018-05-07 NOTE — ED Notes (Signed)
Pt unable to hold still for blood pressure at this time.

## 2018-05-07 NOTE — ED Notes (Signed)
Per pt family, states the caregiver/aid was bathing the pt and the slid out of the chair onto the floor on Wednesday. States the pt has not c/o any pain or discomfort, states they noticed bruising and swelling to the left arm from upper arm to forearm today and [facility] and referred the pt to the ED for further treatment. Pt is alert at present at baseline

## 2018-05-07 NOTE — Discharge Instructions (Addendum)
Use the sling while awake to help support the left arm. Avoid lifting her by the left arm or pulling on the left arm.   Dg Elbow Complete Left  Result Date: 05/07/2018 CLINICAL DATA:  Larey SeatFell out of chair. Bruising and swelling of left forearm and elbow. EXAM: LEFT ELBOW - COMPLETE 3+ VIEW COMPARISON:  None. FINDINGS: A slightly impacted, transverse supracondylar fracture is identified. No dislocation identified. No radio-opaque foreign bodies identified. IMPRESSION: 1. Impacted transverse supracondylar fracture is identified. 2. Joint effusion. Electronically Signed   By: Signa Kellaylor  Stroud M.D.   On: 05/07/2018 19:56

## 2018-05-07 NOTE — ED Triage Notes (Signed)
[facility].  Pt has alz disease.  Sister states she slipped out of her wheelchair last week.  Today family noticed bruising to left elbow.  Pt has downs syndrome.

## 2018-05-07 NOTE — ED Provider Notes (Signed)
[facility] Emergency Department Provider Note  ____________________________________________  Time seen: Approximately 8:49 PM  I have reviewed the triage vital signs and the nursing notes.   HISTORY  Chief Complaint Fall  Level 5 Caveat: Portions of the History and Physical including HPI and review of systems are unable to be completely obtained due to patient being a poor historian due to chronic dementia   HPI Lauren Nixon is a 52 y.o. female with a history of dementia, Down syndrome, GERD who is currently in hospice care brought to the ED for evaluation due to swelling and bruising of the left arm around the elbow.  First noticed today although some of the bruising does look old according to caregivers.  They note that she did have a slight out of a chair after a shower on the floor a week ago which they did not think she sustained any significant injury from.  No other trauma that they can relate.  Patient does not indicate any particular pain.  Baseline state of health.      Past Medical History:  Diagnosis Date  . Dementia (HCC)   . Down's syndrome   . GERD (gastroesophageal reflux disease)   . Hirschsprung disease   . Thyroid disease   . Ventral hernia      Patient Active Problem List   Diagnosis Date Noted  . Facial cellulitis 12/27/2016  . Pneumonia 04/19/2016  . Pure hypercholesterolemia 03/29/2016  . Aftercare following surgery 03/20/2015  . 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 Disease of thyroid gland 03/19/2015  . Incarcerated ventral hernia 03/15/2015  . Partial bowel obstruction (HCC)   . Ventral hernia with obstruction and without gangrene   . Allergic rhinitis, seasonal 12/23/2014  . Gastro-esophageal reflux disease without esophagitis 12/23/2014  . 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 COLOSTOMY    . VENTRAL HERNIA REPAIR N/A 03/15/2015   Procedure: VENTRAL HERNIA  REPAIR FOR INCARCERATED VENTRAL HERNIA ;  Surgeon: Ida Roguehristopher Lundquist, MD;  Location: [facility] ORS;  Service: General;  Laterality: N/A;     Prior to Admission medications   Medication Sig Start Date End Date Taking? Authorizing Provider  acetaminophen (TYLENOL) 500 MG tablet Take 500 mg by mouth 2 (two) times daily.    [provider]  aspirin EC 81 MG tablet Take 81 mg by mouth daily.    [provider]  cetirizine (ZYRTEC) 10 MG tablet Take 10 mg by mouth daily.    [provider]  Cholecalciferol (VITAMIN D3) 1000 units CAPS Take 1,000 Units by mouth daily.    [provider]  esomeprazole (NEXIUM) 40 MG capsule Take 1 capsule by mouth daily.     [provider]  fluticasone (FLONASE) 50 MCG/ACT nasal spray Place 1 spray into both nostrils 2 (two) times daily.     [provider]  Ginkgo Biloba 60 MG TABS Take 60 mg by mouth daily.    [provider]  lamoTRIgine (LAMICTAL) 100 MG tablet Take 100 mg by mouth 2 (two) times daily. 11/23/16   [provider]  levothyroxine (SYNTHROID, LEVOTHROID) 75 MCG tablet Take 0.5 tablets by mouth daily.    [provider]  Melatonin 3 MG TABS Take 3 mg by mouth at bedtime.     [provider]  Multiple Vitamins-Minerals (MULTIVITAMIN GUMMIES ADULT) CHEW Chew 1 tablet by mouth daily.    [provider]  ranitidine (ZANTAC) 300 MG  tablet Take 300 mg by mouth every evening.    [provider]  sulfamethoxazole-trimethoprim (BACTRIM DS,SEPTRA DS) 800-160 MG tablet Take 1 tablet by mouth every 12 (twelve) hours. 12/28/16   Alford Highland, MD  vitamin B-12 (CYANOCOBALAMIN) 1000 MCG tablet Take 1,000 mcg by mouth daily.    [provider]     Allergies Penicillins   Family History  Problem Relation Age of Onset  . Hypertension Father     Social History Social History   Tobacco Use  . Smoking status: Never Smoker  . Smokeless tobacco:  Never Used  Substance Use Topics  . Alcohol use: No  . Drug use: No    Review of Systems Level 5 Caveat: Portions of the History and Physical including HPI and review of systems are unable to be completely obtained due to patient being a poor historian due to dementia  ____________________________________________   PHYSICAL EXAM:  VITAL SIGNS: ED Triage Vitals  Enc Vitals Group     BP --      Pulse Rate 05/07/18 1600 83     Resp 05/07/18 1600 18     Temp 05/07/18 1608 97.8 F (36.6 C)     Temp Source 05/07/18 1600 Oral     SpO2 05/07/18 1600 96 %     Weight 05/07/18 1605 75 lb (34 kg)     Height 05/07/18 1605 4' (1.219 m)     Head Circumference --      Peak Flow --      Pain Score --      Pain Loc --      Pain Edu? --      Excl. in GC? --     Vital signs reviewed, nursing assessments reviewed.   Constitutional: Awake and alert, not oriented. Non-toxic appearance. Eyes:   Conjunctivae are normal. EOMI. ENT      Head:   Normocephalic and atraumatic.      Nose: No epistaxis       Neck:   No meningismus. Full ROM. Musculoskeletal:   Normal range of motion in all extremities. No joint effusions.  Pain with palpation of the left elbow supracondylar area.  No crepitus or swelling. Neurologic:   Nonverbal. Motor grossly intact. No acute focal neurologic deficits are appreciated.  Skin:    Skin is warm, dry and intact.  No lacerations noted.  Subacute bruising with yellowish discoloration over the left elbow. ____________________________________________    LABS (pertinent positives/negatives) (all labs ordered are listed, but only abnormal results are displayed) Labs Reviewed - No data to display ____________________________________________   EKG    ____________________________________________    RADIOLOGY  Dg Elbow Complete Left  Result Date: 05/07/2018 CLINICAL DATA:  Larey Seat out of chair. Bruising and swelling of left forearm and elbow. EXAM: LEFT ELBOW -  COMPLETE 3+ VIEW COMPARISON:  None. FINDINGS: A slightly impacted, transverse supracondylar fracture is identified. No dislocation identified. No radio-opaque foreign bodies identified. IMPRESSION: 1. Impacted transverse supracondylar fracture is identified. 2. Joint effusion. Electronically Signed   By: Signa Kell M.D.   On: 05/07/2018 19:56    ____________________________________________   PROCEDURES Procedures  ____________________________________________    CLINICAL IMPRESSION / ASSESSMENT AND PLAN / ED COURSE  Pertinent labs & imaging results that were available during my care of the patient were reviewed by me and considered in my medical decision making (see chart for details).    Patient presents with bruising of the left elbow concern for injury.  They bring  x-rays performed from clinic which on my review do appear to show a nondisplaced supracondylar fracture.  Therefore I ordered repeat imaging to be performed here in the ED so that radiologist could evaluate.  Radiology review does confirm an impacted supracondylar fracture.  Patient is neurovascular intact, no signs of compartment syndrome.  The injury is a week old at this point according to history and corroborated by age of bruising.  She would not be a surgical candidate.  Recommend follow-up with orthopedics to assess for casting, but unclear if the patient would tolerate this.  Her pain is controlled currently and she is not in distress, discharged home in a sling to follow-up with hospice.      ____________________________________________   FINAL CLINICAL IMPRESSION(S) / ED DIAGNOSES    Final diagnoses:  Closed supracondylar fracture of left humerus, initial encounter     ED Discharge Orders    None      Portions of this note were generated with dragon dictation software. Dictation errors may occur despite best attempts at proofreading.    Sharman Cheek, MD 05/07/18 [phone]

## 2018-05-14 DIAGNOSIS — Z933 Colostomy status: Secondary | ICD-10-CM | POA: Diagnosis not present

## 2018-06-01 DIAGNOSIS — R634 Abnormal weight loss: Secondary | ICD-10-CM | POA: Diagnosis not present

## 2018-06-01 DIAGNOSIS — Q909 Down syndrome, unspecified: Secondary | ICD-10-CM | POA: Diagnosis not present

## 2018-06-01 DIAGNOSIS — G309 Alzheimer's disease, unspecified: Secondary | ICD-10-CM | POA: Diagnosis not present

## 2018-06-01 DIAGNOSIS — F0281 Dementia in other diseases classified elsewhere with behavioral disturbance: Secondary | ICD-10-CM | POA: Diagnosis not present

## 2018-06-14 DIAGNOSIS — Z933 Colostomy status: Secondary | ICD-10-CM | POA: Diagnosis not present

## 2018-07-21 DEATH — deceased
